# Patient Record
Sex: Female | Born: 1965 | Race: White | Hispanic: No | Marital: Married | Smoking: Former smoker
Health system: Southern US, Community
[De-identification: ages and names within clinical notes are randomized; demographics above are authoritative.]

## PROBLEM LIST (undated history)

## (undated) DIAGNOSIS — Z803 Family history of malignant neoplasm of breast: Secondary | ICD-10-CM

## (undated) DIAGNOSIS — F17200 Nicotine dependence, unspecified, uncomplicated: Secondary | ICD-10-CM

## (undated) DIAGNOSIS — Z720 Tobacco use: Secondary | ICD-10-CM

## (undated) DIAGNOSIS — Z460 Encounter for fitting and adjustment of spectacles and contact lenses: Secondary | ICD-10-CM

## (undated) HISTORY — PX: PILONIDAL CYST EXCISION: SHX744

## (undated) HISTORY — PX: FOOT SURGERY: SHX648

## (undated) HISTORY — DX: Tobacco use: Z72.0

## (undated) HISTORY — DX: Nicotine dependence, unspecified, uncomplicated: F17.200

## (undated) HISTORY — DX: Encounter for fitting and adjustment of spectacles and contact lenses: Z46.0

## (undated) HISTORY — DX: Family history of malignant neoplasm of breast: Z80.3

---

## 2001-02-17 ENCOUNTER — Other Ambulatory Visit: Admission: RE | Admit: 2001-02-17 | Discharge: 2001-02-17 | Payer: Self-pay | Admitting: Obstetrics and Gynecology

## 2004-03-28 ENCOUNTER — Ambulatory Visit: Payer: Self-pay | Admitting: Family Medicine

## 2004-04-29 ENCOUNTER — Ambulatory Visit: Payer: Self-pay | Admitting: Family Medicine

## 2004-06-25 ENCOUNTER — Ambulatory Visit: Payer: Self-pay | Admitting: Family Medicine

## 2004-09-05 ENCOUNTER — Ambulatory Visit: Payer: Self-pay | Admitting: Family Medicine

## 2005-01-16 ENCOUNTER — Ambulatory Visit: Payer: Self-pay | Admitting: Family Medicine

## 2005-03-23 ENCOUNTER — Ambulatory Visit: Payer: Self-pay | Admitting: Family Medicine

## 2005-04-21 ENCOUNTER — Ambulatory Visit: Payer: Self-pay | Admitting: Internal Medicine

## 2005-04-22 ENCOUNTER — Ambulatory Visit: Payer: Self-pay | Admitting: Family Medicine

## 2005-11-20 ENCOUNTER — Ambulatory Visit: Payer: Self-pay | Admitting: Family Medicine

## 2006-02-04 HISTORY — PX: TUBAL LIGATION: SHX77

## 2006-02-17 ENCOUNTER — Ambulatory Visit (HOSPITAL_COMMUNITY): Admission: RE | Admit: 2006-02-17 | Discharge: 2006-02-17 | Payer: Self-pay | Admitting: Obstetrics and Gynecology

## 2006-02-23 ENCOUNTER — Ambulatory Visit: Payer: Self-pay | Admitting: Family Medicine

## 2006-06-10 DIAGNOSIS — K449 Diaphragmatic hernia without obstruction or gangrene: Secondary | ICD-10-CM | POA: Insufficient documentation

## 2006-06-10 DIAGNOSIS — L259 Unspecified contact dermatitis, unspecified cause: Secondary | ICD-10-CM | POA: Insufficient documentation

## 2006-06-10 DIAGNOSIS — F17200 Nicotine dependence, unspecified, uncomplicated: Secondary | ICD-10-CM | POA: Insufficient documentation

## 2006-06-10 DIAGNOSIS — N390 Urinary tract infection, site not specified: Secondary | ICD-10-CM | POA: Insufficient documentation

## 2006-06-10 HISTORY — DX: Nicotine dependence, unspecified, uncomplicated: F17.200

## 2006-08-09 ENCOUNTER — Telehealth (INDEPENDENT_AMBULATORY_CARE_PROVIDER_SITE_OTHER): Payer: Self-pay | Admitting: Internal Medicine

## 2007-06-02 ENCOUNTER — Ambulatory Visit: Payer: Self-pay | Admitting: Family Medicine

## 2007-06-02 DIAGNOSIS — J069 Acute upper respiratory infection, unspecified: Secondary | ICD-10-CM | POA: Insufficient documentation

## 2007-09-13 ENCOUNTER — Ambulatory Visit: Payer: Self-pay | Admitting: Family Medicine

## 2007-11-30 ENCOUNTER — Telehealth: Payer: Self-pay | Admitting: Family Medicine

## 2008-06-25 ENCOUNTER — Ambulatory Visit: Payer: Self-pay | Admitting: Family Medicine

## 2008-07-05 ENCOUNTER — Ambulatory Visit: Payer: Self-pay | Admitting: Family Medicine

## 2008-07-05 DIAGNOSIS — J019 Acute sinusitis, unspecified: Secondary | ICD-10-CM | POA: Insufficient documentation

## 2008-12-19 ENCOUNTER — Ambulatory Visit: Payer: Self-pay | Admitting: Family Medicine

## 2009-04-04 ENCOUNTER — Ambulatory Visit: Payer: Self-pay | Admitting: Family Medicine

## 2009-04-10 ENCOUNTER — Telehealth (INDEPENDENT_AMBULATORY_CARE_PROVIDER_SITE_OTHER): Payer: Self-pay | Admitting: Internal Medicine

## 2009-11-07 ENCOUNTER — Encounter (INDEPENDENT_AMBULATORY_CARE_PROVIDER_SITE_OTHER): Payer: Self-pay | Admitting: *Deleted

## 2010-05-08 NOTE — Letter (Signed)
Summary: Shawna [name] letter  [place]  [address] [place], [place] [address]   Phone: [phone]  Fax: [phone]       11/07/2009 MRN: [id number]  Shawna [name] [address] [place], [place]  [address]  Dear Ms. [facility] [place] [place] announce the retirement of Arta Silence, M.D., from full-time practice at the [facility] office effective October 03, 2009 and his plans of returning part-time.  It is important to Dr. Hetty Ely and to our practice that you understand that [facility] has seven physicians in our office for your health care needs.  We will continue to offer the same exceptional care that you have today.    Dr. Hetty Ely has spoken to many of you about his plans for retirement and returning part-time in the fall.   We will continue to work with you through the transition to schedule appointments for you in the office and meet the high standards that [facility] is committed to.   Again, it is with great pleasure that we share the news that Dr. Hetty Ely will return to [facility] in October of 2011 with a reduced schedule.    If you have any questions, or would like to request an appointment with one of our physicians, please call us at [phone] and press the option for Scheduling an appointment.  We take pleasure in providing you with excellent patient care and look forward to seeing you at your next office visit.  Our [facility] Physicians are:  Tillman Abide, M.D. Laurita Quint, M.D. Roxy Manns, M.D. Kerby Nora, M.D. Hannah Beat, M.D. Ruthe Mannan, M.D. We proudly welcomed Raechel Ache, M.D. and Eustaquio Boyden, M.D. to the practice in July/August 2011.  Sincerely,  [facility]

## 2010-05-08 NOTE — Progress Notes (Signed)
Summary: Not feeling better  Phone Note Call from Patient Call back at [phone]   Caller: Patient Call For: [name] Summary of Call: pt was seen on 04/04/2009 and was given a z-pak for sinus infection, pt states she feels no better and would like something else called in, still with congestion.Please advise. Initial call taken by: Mervin Hack CMA Duncan Dull),  April 10, 2009 1:52 PM  Follow-up for Phone Call        still has ATB in her system, if not improved or if worsens, needs to be seen before Friday   Shawna Daniels Galen Russman [name]  April 10, 2009 3:56 PM   pt called back and states she has taken Levaquin in the past is this something she can take again? pt can't come in before friday. Pt would like rx called in to [facility]. DeShannon Smith CMA Duncan Dull)  April 10, 2009 4:42 PM   Additional Follow-up for Phone Call Additional follow up Details #1::        will need to be seen on Friday  Shawna [name]  April 10, 2009 5:17 PM   Patient notified as instructed by telephone. Pt said she just saw you and she is not going to pay another copay. Pt said she is the same or possibly a little worse now than when she saw you and wants a stronger antibiotic. Pt wants to talk with [name]. Pt can be reached at [phone]. Please advise.     Additional Follow-up for Phone Call Additional follow up Details #2::    will refill zpac without being seen  Shawna [name]  April 11, 2009 9:31 AM   Patient notified as instructed by telephone. Pt said she does not want the Z pack. Pt said she needs stronger antibiotic and pt strongly request Levaquin. Please advise. [name] LPN  April 11, 2009 11:25 AM   Pt has called back and wants to know if she is going to get the Levaquin. I asked Billie [name], [name] and she said she will give her a Zpack or she can come in to be seen. [name] said she has had another death in her family and has to get on the road and ask to speak  with [name]. [name] will call pt back at [phone]. [name] LPN  April 11, 2009 2:06 PM   called--will start on levaquin, Rx attatched  Shawna [name]  April 11, 2009 2:27 PM   New/Updated Medications: LEVAQUIN 500 MG TABS (LEVOFLOXACIN) take 1 daily [BMN] Prescriptions: LEVAQUIN 500 MG TABS (LEVOFLOXACIN) take 1 daily Brand medically necessary #5 x 0   Entered and Authorized by:   Gildardo Griffes [name]   Signed by:   Gildardo Griffes [name] on 04/11/2009   Method used:   Electronically to        [facility]* (retail)       [address]       [place], [place]  [address]       Ph: [phone]       Fax: [phone]   RxID:   [id number]

## 2010-06-16 ENCOUNTER — Ambulatory Visit: Payer: Self-pay | Admitting: Family Medicine

## 2010-06-16 ENCOUNTER — Encounter: Payer: Self-pay | Admitting: Family Medicine

## 2010-06-16 ENCOUNTER — Ambulatory Visit (INDEPENDENT_AMBULATORY_CARE_PROVIDER_SITE_OTHER): Payer: Federal, State, Local not specified - PPO | Admitting: Family Medicine

## 2010-06-16 DIAGNOSIS — J019 Acute sinusitis, unspecified: Secondary | ICD-10-CM

## 2010-06-24 NOTE — Assessment & Plan Note (Signed)
Summary: ? SINUS INFECTION   Vital Signs:  Patient profile:   45 year old female Height:      63 inches Weight:      186.75 pounds BMI:     33.20 Temp:     98.3 degrees F oral Pulse rate:   94 / minute Pulse rhythm:   regular BP sitting:   122 / 82  (left arm) Cuff size:   regular  Vitals Entered By: Linde Gillis CMA Duncan Dull) (June 16, 2010 11:52 AM) CC: ? sinus infection   History of Present Illness: Here for URI signsx 1 1/2 weeks. Started with runny nose, dry cough. Now cough keeping her up at night, frontal sinus pressure. Felt feverish last night.  Taking OTC Zyrtec, no relief of symptoms.  No CP, wheezing, or SOB.    Current Medications (verified): 1)  Multivitamins   Tabs (Multiple Vitamin) .... One Daily 2)  Lotrisone 1-0.05 % Crea (Clotrimazole-Betamethasone) .... Apply Two Times A Day Thinnly To Rash As Needed 3)  Azithromycin 250 Mg  Tabs (Azithromycin) .... 2 By  Mouth Today and Then 1 Daily For 4 Days  Allergies: 1)  ! Sulfa 2)  ! Macrobid 3)  ! * Amoxillin 4)  ! * Guaifenesin  Past History:  Past Medical History: Last updated: 07/05/2008 tab abuse  Past Surgical History: Last updated: 06/10/2006 Tubal ligation: bilateral:  02/2006 EGD:  07/1985, hiatal hernia, mild esophigitis  Family History: Last updated: 04/04/2009 Father:  Mother: small vesseldiseasse/dementia--died 09-Apr-2009--Alzheimer's Siblings:   Social History: Last updated: 07/05/2008 Marital Status: Married Children: 0 Occupation: [employer] Current Smoker cares for family member with alz  Risk Factors: Smoking Status: current (07/05/2008) Packs/Day: 08.01.2001 (06/10/2006)  Review of Systems General:  Complains of fever. CV:  Denies chest pain or discomfort. Resp:  Complains of cough; denies sputum productive and wheezing.  Physical Exam  General:  Well-developed,well-nourished,in no acute distress; alert,appropriate and cooperative throughout examination VSS Nose:  nares  are injected and congested bilaterally  frontal sinuses TTP, right> left Mouth:  pharynx pink and moist, no erythema, and no exudates.   Lungs:  diffusely distant bs good air exch  no rales/rhonchi or wheeze Heart:  Normal rate and regular rhythm. S1 and S2 normal without gallop, murmur, click, rub or other extra sounds. Psych:  normal affect, talkative and pleasant - pt seems fatigued    Impression & Recommendations:  Problem # 1:  SINUSITIS - ACUTE-NOS (ICD-461.9) Assessment New Given duration and progression of symptoms, will treat with Zpack (multiple abx allergies). Supportive care as per pt instructions. The following medications were removed from the medication list:    Levaquin 500 Mg Tabs (Levofloxacin) .Marland Kitchen... Take 1 daily Her updated medication list for this problem includes:    Azithromycin 250 Mg Tabs (Azithromycin) .Marland Kitchen... 2 by  mouth today and then 1 daily for 4 days  Complete Medication List: 1)  Multivitamins Tabs (Multiple vitamin) .... One daily 2)  Lotrisone 1-0.05 % Crea (Clotrimazole-betamethasone) .... Apply two times a day thinnly to rash as needed 3)  Azithromycin 250 Mg Tabs (Azithromycin) .... 2 by  mouth today and then 1 daily for 4 days  Patient Instructions: 1)  Take Zpack  as directed.  Drink lots of fluids.  Treat sympotmatically with Mucinex, nasal saline irrigation, and Tylenol/Ibuprofen. Also try claritin D or zyrtec D over the counter- two times a day as needed ( have to sign for them at pharmacy). You can use warm compresses.  Cough suppressant at  night. Call if not improving as expected in 5-7 days.  Prescriptions: AZITHROMYCIN 250 MG  TABS (AZITHROMYCIN) 2 by  mouth today and then 1 daily for 4 days  #6 x 0   Entered and Authorized by:   Ruthe Mannan MD   Signed by:   Ruthe Mannan MD on 06/16/2010   Method used:   Electronically to        [facility]* (retail)       [address]       [place], [place]  [address]       Ph: [phone]       Fax:  [phone]   RxID:   [id number]    Orders Added: 1)  Est. Patient Level III [40347]    Current Allergies (reviewed today): ! SULFA ! MACROBID ! * AMOXILLIN ! * GUAIFENESIN

## 2010-08-22 NOTE — Assessment & Plan Note (Signed)
[facility]                                 ON-CALL NOTE   NAME:Shawna [name], Shawna [name]                      MRN:          [id number]  DATE:02/25/2006                            DOB:          09/14/1965    TIME RECEIVED:  11:34 a.m.   CALLER:  Lavone Orn.   She sees [facility] [employer]   TELEPHONE:  [phone].   Patient is apparently having a medication reaction.  She has had  penicillin numerous times throughout her life; however, she is currently  taking amoxicillin 500 mg 2 tablets b.i.d. for a sinus infection.  She  has been taking it for four days.  Now for the past two days, she has  nausea, vomiting, and diarrhea.  Also an itchy rash all over her body.  She is not short of breath.   Our response is to stop the amoxicillin.  She is to drink fluids.  She  can take Benadryl as needed.  She is to contact the office tomorrow for  further instructions.     Tera Mater. Clent Ridges, MD  Electronically Signed    SAF/MedQ  DD: 02/25/2006  DT: 02/25/2006  Job #: [id number]

## 2010-08-22 NOTE — Op Note (Signed)
NAMEAASTHA, DAYLEY               ACCOUNT NO.:  [id number]   MEDICAL RECORD NO.:  [id number]          PATIENT TYPE:  AMB   LOCATION:  SDC                           FACILITY:  [facility]   PHYSICIAN:  Carrington Clamp, M.D. DATE OF BIRTH:  31-May-1965   DATE OF PROCEDURE:  02/17/2006  DATE OF DISCHARGE:                               OPERATIVE REPORT   PREOPERATIVE DIAGNOSIS:  Desires permit sterility.   POSTOPERATIVE DIAGNOSIS:  Desires permit sterility.   PROCEDURES:  Filshie clip bilateral tubal ligation through laparoscope.   ATTENDING PHYSICIAN:  Carrington Clamp, M.D.   ANESTHESIA:  General.   SPECIMENS:  None.   ESTIMATED BLOOD LOSS:  Minimal.   IV FLUIDS:  800 mL.   URINE OUTPUT:  Not measured.   COMPLICATIONS:  None.   FINDINGS:  Normal tubes and ovaries with the exception of small clear  cyst on the left tube.  There were adhesions of the tubes and fimbria to  the ovaries.  There are no other adhesions seen in the pelvis, however.  There was small fibroids on the serosal surface of the uterus.  The  appendix was not seen but the liver edge appeared normal.  There were no  other abnormalities of the cul-de-sac or the pelvis.   MEDICATIONS:  None.   COUNTS:  Correct x3.   TECHNIQUE:  After adequate general anesthesia was achieved, the patient  prepped, draped usual sterile fashion in dorsal lithotomy position.  A  Kahn cannula was placed inside the cervix after a single-tooth tenaculum  was used to stabilize the cervix.  The bladder was drained with red  rubber catheter during the procedure.  Attention was then turned to the  abdomen where a 2 cm infraumbilical incision was made with a scalpel.  The Veress needle was passed into the abdomen without aspiration of  bowel contents or blood.  The abdomen was insufflated and the 10 mm  trocar placed without complication.  The scope was placed inside the  abdomen.  The above findings noted.  Filshie clip  instrument was  then passed and a Filshie clip was placed on the isthmic  portion of each tube.  The photos were taken to prove that the Filshie  clip was   DICTATION ENDED HERE.      Carrington Clamp, M.D.  Electronically Signed     MH/MEDQ  D:  02/17/2006  T:  02/17/2006  Job:  [id number]

## 2010-09-25 ENCOUNTER — Telehealth: Payer: Self-pay | Admitting: *Deleted

## 2010-09-25 MED ORDER — BUPROPION HCL ER (SMOKING DET) 150 MG PO TB12: 150.0000 mg | ORAL_TABLET | Freq: Two times a day (BID) | ORAL | Status: AC

## 2010-09-25 NOTE — Telephone Encounter (Signed)
Left message on machine at home advising patient as instructed. 

## 2010-09-25 NOTE — Telephone Encounter (Signed)
Patient says that a couple of years ago she had taken wellbutrin to try and quit smoking and then a lot happened with her mother and she went off of it and was unable to quit smoking at the time. She is going on vacation in a couple of weeks and would really like to quit smoking. She is asking if she could get a rx for wellbutrin. Uses  [facility]. Please advise.

## 2010-09-25 NOTE — Telephone Encounter (Signed)
RX sent

## 2010-11-05 ENCOUNTER — Encounter: Payer: Self-pay | Admitting: Family Medicine

## 2010-11-05 ENCOUNTER — Ambulatory Visit (INDEPENDENT_AMBULATORY_CARE_PROVIDER_SITE_OTHER): Payer: Federal, State, Local not specified - PPO | Admitting: Family Medicine

## 2010-11-05 VITALS — BP 110/70 | HR 92 | Temp 98.4°F | Ht 63.0 in | Wt 187.8 lb

## 2010-11-05 DIAGNOSIS — L0591 Pilonidal cyst without abscess: Secondary | ICD-10-CM

## 2010-11-05 MED ORDER — AZITHROMYCIN 250 MG PO TABS: ORAL_TABLET | ORAL | Status: AC

## 2010-11-05 MED ORDER — HYDROCODONE-ACETAMINOPHEN 5-500 MG PO TABS: 1.0000 | ORAL_TABLET | Freq: Four times a day (QID) | ORAL | Status: DC | PRN

## 2010-11-05 MED ORDER — METRONIDAZOLE 500 MG PO TABS: 500.0000 mg | ORAL_TABLET | Freq: Three times a day (TID) | ORAL | Status: AC

## 2010-11-05 NOTE — Progress Notes (Signed)
  Subjective:    Patient ID: Shawna [name], female    DOB: 15-Dec-1965, 45 y.o.   MRN: [id number]  HPI Is having tailbone area pain  "hurts like hell"  Pain started on Monday afternoon   Has had a cyst there in the past -- it had to be drained and then removed  This is more on the side  ? Red   Hurts to sit   No hx of hemorrhoids   No pain with certain movements   No drainage   No fever - feels fine except for pain   Patient Active Problem List  Diagnoses  . TOBACCO ABUSE  . SINUSITIS - ACUTE-NOS  . URI  . HIATAL HERNIA  . UTI'S, CHRONIC  . ECZEMA, HANDS  . Infected pilonidal cyst   Past Medical History  Diagnosis Date  . Tobacco abuse    Past Surgical History  Procedure Date  . Tubal ligation 02/2006    bilateral   History  Substance Use Topics  . Smoking status: Current Everyday Smoker  . Smokeless tobacco: Not on file  . Alcohol Use:    No family history on file. Allergies  Allergen Reactions  . Amoxicillin   . Guaifenesin     REACTION: HA  . Nitrofurantoin   . Sulfonamide Derivatives    No current outpatient prescriptions on file prior to visit.         Review of Systems Review of Systems  Constitutional: Negative for fever, appetite change, fatigue and unexpected weight change.  Eyes: Negative for pain and visual disturbance.  Respiratory: Negative for cough and shortness of breath.   Cardiovascular: Negative.  for cp or palpitations Gastrointestinal: Negative for nausea, diarrhea and constipation.  Genitourinary: Negative for urgency and frequency.  Skin: Negative for pallor. or rash  Neurological: Negative for weakness, light-headedness, numbness and headaches.  Hematological: Negative for adenopathy. Does not bruise/bleed easily.  Psychiatric/Behavioral: Negative for dysphoric mood. The patient is not nervous/anxious.          Objective:   Physical Exam  Constitutional: She appears well-developed and well-nourished. No  distress.       overwt and well appearing   HENT:  Head: Normocephalic and atraumatic.  Mouth/Throat: Oropharynx is clear and moist.  Eyes: Conjunctivae and EOM are normal. Pupils are equal, round, and reactive to light.  Neck: Normal range of motion. Neck supple. No JVD present. No thyromegaly present.  Cardiovascular: Normal rate, regular rhythm, normal heart sounds and intact distal pulses.   Pulmonary/Chest: Effort normal and breath sounds normal. No respiratory distress. She has no wheezes.  Abdominal: Soft. Bowel sounds are normal. She exhibits no distension. There is no tenderness. There is no rebound.  Musculoskeletal: Normal range of motion.  Lymphadenopathy:    She has no cervical adenopathy.  Neurological: She is alert. She has normal reflexes.  Skin: Skin is warm and dry. No pallor.       1-2 cm area of induration/ erythema and heat in coccyx area just below prev pilonidal cyst scar  No drainage or hair noted  Very firm Moderately tender  Psychiatric: She has a normal mood and affect.          Assessment & Plan:

## 2010-11-05 NOTE — Assessment & Plan Note (Signed)
Pt has had one surgically removed in past and this is just below the scar  Is very firm with no drainage For cellulitis - px flagyl and zithromax (is pcn all)  Warm compresses often- keep clean  Ref to surgeon for eval and tx Adv to call if worse pain /fever or other symptoms or seek care in ER if after hours

## 2010-11-05 NOTE — Patient Instructions (Signed)
Take the zithromax and the metronizadole as directed  Warm compresses as often as you can  If increase in pain or any fever - call or go to ER or urgent care if after hours  We will do surgical referral at check out  Use pain pill (vicodin) if needed- but watch out for sedation

## 2010-11-06 ENCOUNTER — Encounter (INDEPENDENT_AMBULATORY_CARE_PROVIDER_SITE_OTHER): Payer: Self-pay | Admitting: Surgery

## 2010-11-06 ENCOUNTER — Ambulatory Visit (INDEPENDENT_AMBULATORY_CARE_PROVIDER_SITE_OTHER): Payer: Federal, State, Local not specified - PPO | Admitting: Surgery

## 2010-11-06 VITALS — BP 106/78 | HR 64 | Temp 96.8°F | Ht 63.5 in | Wt 185.6 lb

## 2010-11-06 DIAGNOSIS — L0591 Pilonidal cyst without abscess: Secondary | ICD-10-CM

## 2010-11-06 NOTE — Patient Instructions (Addendum)
Pilonidal Cyst Care After A pilonidal cyst occurs when hairs get trapped (ingrown) beneath the skin in the crease between the buttocks over your sacrum (the bone under that crease). Pilonidal cysts are most common in young men with a lot of body hair. When the cyst breaks (ruptured) or leaks, fluid from the cyst may cause burning and itching. If the cyst becomes infected, it causes a painful swelling filled with pus (abscess). The pus and trapped hairs need to be removed (often by lancing) so that the infection can heal. The word pilonidal means hair nest. HOME CARE INSTRUCTIONS AFTER YOUR SURGERY The cyst WAS INFECTED AND DRAINED:  Your caregiver packed the wound with gauze to keep the wound open. This allows the wound to heal from the inside outward and continue to drain.  The packing should gradually fall out over time. Trimmed excess tails of the packing these next few days until completely falls out. Then start packing.  Return in 1 week for a wound check.   If you take tub baths or showers, repack the wound with gauze as directed following. Sponge baths are a good alternative. Sitz baths may be used three to four times a day or as directed.   Continue the antibiotic to fight the infection as directed.   Only take over-the-counter or prescription medicines for pain, discomfort, or fever as directed by your caregiver.   If a drain was in place and removed, use sitz baths for 20 minutes 4 times per day. Clean the wound gently with mild unscented soap, pat dry, and then apply a dry dressing as directed.   COMPLICATIONS All surgery may have complications with the very best of care. Some complications of this surgery include:  Infection.  Long term drainage.   Bleeding.  Possibly further surgery.   Long term wound care.  Non healing wound.   SEEK MEDICAL CARE IF:  You have increased pain, swelling, redness, drainage, or bleeding from the area.   An oral temperature above 102F  develops.   You have muscles aches, dizziness, or a general ill feeling.  Document Released: 04/23/2006 Document Re-Released: 09/10/2009 [facility] Patient Information 2011 [place], [place].

## 2010-11-06 NOTE — Progress Notes (Addendum)
Subjective:     Patient ID: Shawna [name], female   DOB: 11-26-1965, 45 y.o.   MRN: [id number]  HPI  Pleasant female with a history of prior pilonidal disease. She had an I&D done 20 years ago by Dr. Derrell Lolling.  It recurred. She required a more formal excision and closure.  She's not had any problems since then until 3 days ago she started feeling pain and swelling. It got worse. She saw her primary care physician. Dr. Lucretia Roers patient is over concerns of a new abscess.  No fevers chills or sweats. She is on oral antibiotics. Review of Systems  Constitutional: Negative for fever, chills, diaphoresis, appetite change and fatigue.  HENT: Negative for ear pain, sore throat, trouble swallowing, neck pain and ear discharge.   Eyes: Negative for photophobia, discharge and visual disturbance.  Respiratory: Negative for cough, choking, chest tightness and shortness of breath.   Cardiovascular: Negative for chest pain and palpitations.  Gastrointestinal: Negative for nausea, vomiting, abdominal pain, diarrhea, constipation, anal bleeding and rectal pain.  Genitourinary: Negative for dysuria, frequency and difficulty urinating.  Musculoskeletal: Negative for myalgias and gait problem.  Skin: Negative for color change, pallor and rash.  Neurological: Negative for dizziness, speech difficulty, weakness and numbness.  Hematological: Negative for adenopathy.  Psychiatric/Behavioral: Negative for confusion and agitation. The patient is not nervous/anxious.        Objective:   Physical Exam  Constitutional: She is oriented to person, place, and time. She appears well-developed and well-nourished. No distress.  HENT:  Head: Normocephalic.  Mouth/Throat: Oropharynx is clear and moist. No oropharyngeal exudate.  Eyes: Conjunctivae and EOM are normal. Pupils are equal, round, and reactive to light. No scleral icterus.  Neck: Normal range of motion. Neck supple. No tracheal deviation present.    Cardiovascular: Normal rate, regular rhythm and intact distal pulses.   Pulmonary/Chest: Effort normal and breath sounds normal. No respiratory distress. She exhibits no tenderness.  Abdominal: Soft. She exhibits no distension and no mass. There is no tenderness. Hernia confirmed negative in the right inguinal area and confirmed negative in the left inguinal area.  Genitourinary: No vaginal discharge found.       3 x 5 cm area of induration along the left pilonidal region. Central skin ulceration.  After getting informed consent, I drained the area under local anesthetic.  I did not encounter a very large cavity but I was able to explore close down to the bone. I did evacuate some necrotic fat / cheese material. I packed it with the NU ribbon gauze. She tolerated well.  Musculoskeletal: Normal range of motion. She exhibits no tenderness.  Lymphadenopathy:    She has no cervical adenopathy.       Right: No inguinal adenopathy present.       Left: No inguinal adenopathy present.  Neurological: She is alert and oriented to person, place, and time. No cranial nerve deficit. She exhibits normal muscle tone. Coordination normal.  Skin: Skin is warm and dry. No rash noted. She is not diaphoretic. No erythema.  Psychiatric: She has a normal mood and affect. Her behavior is normal. Judgment and thought content normal.       Assessment:     Pilonidal abscess left paramedian. Status post incision and drainage.    Plan:     Continue antibiotics.  Allow packing for the next few days. Trim if part falls out. Then start repacking. She similar this and felt comfortable doing as herself.  Return to clinic  next week for wound check.  If markedly worse, she may require admission and reoperation. However, I think that's not likely. We gave her handouts. She expressed understanding and appreciation.

## 2010-11-12 ENCOUNTER — Encounter (INDEPENDENT_AMBULATORY_CARE_PROVIDER_SITE_OTHER): Payer: Self-pay | Admitting: Surgery

## 2010-11-12 ENCOUNTER — Ambulatory Visit (INDEPENDENT_AMBULATORY_CARE_PROVIDER_SITE_OTHER): Payer: Federal, State, Local not specified - PPO | Admitting: Surgery

## 2010-11-12 VITALS — BP 118/78 | HR 68 | Temp 96.6°F

## 2010-11-12 DIAGNOSIS — L0591 Pilonidal cyst without abscess: Secondary | ICD-10-CM

## 2010-11-12 NOTE — Patient Instructions (Signed)
Dressing Change Dressings are placed over wounds to keep them clean, dry, and protected from further injury. This provides an environment that favors wound healing. Good wound care includes resting and elevating the injured part until the pain and swelling are better. Change your wound dressing as recommended by your caregiver. When removing an old dressing, lift it slowly away from the wound. If the dressing sticks to the wound, dampen it with half-strength peroxide or tap water. Clean the wound gently with a moist cloth in shower/bathtub, remove any loose material. It is okay for a wound to get wet. Wash it with mildly soapy water. Watch for signs of infection when changing a dressing.  SEEK MEDICAL CARE IF YOU DEVELOP:  Increased pain, redness, or swelling.   Pus-like drainage from the wound.  Fever greater than 102 F

## 2010-11-12 NOTE — Progress Notes (Signed)
Subjective:     Patient ID: Shawna [name], female   DOB: Mar 16, 1966, 45 y.o.   MRN: [id number]  HPI  Diagnosis: Infected pilonidal cyst  Procedure: Incision and drainage November 06, 2010  The patient comes in today feeling much better. She comes today with her husband. The wick has not completely fallen out. However, the husband is trimming it out slowly.   She is moving her bowels 4 times a day. That is her baseline. She is almost done with her antibiotics (Zithromax, metronidazole).  No fevers chills or sweats. She is on oral antibiotics.  Review of Systems  Constitutional: Negative for fever, chills, diaphoresis, appetite change and fatigue.  HENT: Negative for ear pain, sore throat, trouble swallowing, neck pain and ear discharge.   Eyes: Negative for photophobia, discharge and visual disturbance.  Respiratory: Negative for cough, choking, chest tightness and shortness of breath.   Cardiovascular: Negative for chest pain and palpitations.  Gastrointestinal: Negative for nausea, vomiting, abdominal pain, diarrhea, constipation, anal bleeding and rectal pain.  Genitourinary: Negative for dysuria, frequency and difficulty urinating.  Musculoskeletal: Negative for myalgias and gait problem.  Skin: Negative for color change, pallor and rash.  Neurological: Negative for dizziness, speech difficulty, weakness and numbness.  Hematological: Negative for adenopathy.  Psychiatric/Behavioral: Negative for confusion and agitation. The patient is not nervous/anxious.        Objective:   Physical Exam  Constitutional: She is oriented to person, place, and time. She appears well-developed and well-nourished. No distress.  HENT:  Head: Normocephalic.  Mouth/Throat: Oropharynx is clear and moist. No oropharyngeal exudate.  Eyes: Conjunctivae and EOM are normal. Pupils are equal, round, and reactive to light. No scleral icterus.  Neck: Normal range of motion. Neck supple. No tracheal deviation  present.  Cardiovascular: Normal rate, regular rhythm and intact distal pulses.   Pulmonary/Chest: Effort normal and breath sounds normal. No respiratory distress. She exhibits no tenderness.  Abdominal: Soft. She exhibits no distension and no mass. There is no tenderness. Hernia confirmed negative in the right inguinal area and confirmed negative in the left inguinal area.  Genitourinary: No vaginal discharge found.       3 x 1.5 cm area of induration along the left pilonidal region. Decreased & less tender  Nu gauze packing removed.  Central wound with some skin necrosis 1-61mm.  Granulation at base.  I repacked with gauze.  Musculoskeletal: Normal range of motion. She exhibits no tenderness.  Lymphadenopathy:    She has no cervical adenopathy.       Right: No inguinal adenopathy present.       Left: No inguinal adenopathy present.  Neurological: She is alert and oriented to person, place, and time. No cranial nerve deficit. She exhibits normal muscle tone. Coordination normal.  Skin: Skin is warm and dry. No rash noted. She is not diaphoretic. No erythema.  Psychiatric: She has a normal mood and affect. Her behavior is normal. Judgment and thought content normal.       Assessment:     Pilonidal abscess left paramedian. Status post incision and drainage.    Plan:     Complete antibiotics.  Remove dressing. Clean wound and shower bathtub. Then repack with gauze. Do this every day. Her husband felt comfortable doing this.  Return to clinic in 2 weeks for wound check.  If markedly worse, she may require admission and reoperation. However, I think that's not likely. She expressed understanding and appreciation.

## 2010-11-26 ENCOUNTER — Encounter (INDEPENDENT_AMBULATORY_CARE_PROVIDER_SITE_OTHER): Payer: Self-pay | Admitting: Surgery

## 2010-11-26 ENCOUNTER — Ambulatory Visit (INDEPENDENT_AMBULATORY_CARE_PROVIDER_SITE_OTHER): Payer: Federal, State, Local not specified - PPO | Admitting: Surgery

## 2010-11-26 VITALS — BP 112/80 | HR 64 | Temp 96.7°F | Wt 185.0 lb

## 2010-11-26 DIAGNOSIS — L299 Pruritus, unspecified: Secondary | ICD-10-CM | POA: Insufficient documentation

## 2010-11-26 DIAGNOSIS — L290 Pruritus ani: Secondary | ICD-10-CM

## 2010-11-26 DIAGNOSIS — L0591 Pilonidal cyst without abscess: Secondary | ICD-10-CM

## 2010-11-26 NOTE — Patient Instructions (Signed)
See Handout on pruritis ani

## 2010-11-26 NOTE — Progress Notes (Signed)
Subjective:     Patient ID: Shawna [name], female   DOB: 09/11/1965, 45 y.o.   MRN: [id number]  HPI  Diagnosis: Infected pilonidal cyst  Procedure: Incision and drainage November 06, 2010  The patient comes in today feeling much better.  Wound no longer needs to be packed.  No fevers chills or sweats. She is off antibiotics.  Review of Systems  Constitutional: Negative for fever, chills, diaphoresis, appetite change and fatigue.  HENT: Negative for ear pain, sore throat, trouble swallowing, neck pain and ear discharge.   Eyes: Negative for photophobia, discharge and visual disturbance.  Respiratory: Negative for cough, choking, chest tightness and shortness of breath.   Cardiovascular: Negative for chest pain and palpitations.  Gastrointestinal: Negative for nausea, vomiting, abdominal pain, diarrhea, constipation, anal bleeding and rectal pain.  Genitourinary: Negative for dysuria, frequency and difficulty urinating.  Musculoskeletal: Negative for myalgias and gait problem.  Skin: Negative for color change, pallor and rash.  Neurological: Negative for dizziness, speech difficulty, weakness and numbness.  Hematological: Negative for adenopathy.  Psychiatric/Behavioral: Negative for confusion and agitation. The patient is not nervous/anxious.        Objective:   Physical Exam  Constitutional: She is oriented to person, place, and time. She appears well-developed and well-nourished. No distress.  HENT:  Head: Normocephalic.  Mouth/Throat: Oropharynx is clear and moist. No oropharyngeal exudate.  Eyes: Conjunctivae and EOM are normal. Pupils are equal, round, and reactive to light. No scleral icterus.  Neck: Normal range of motion. Neck supple. No tracheal deviation present.  Cardiovascular: Normal rate, regular rhythm and intact distal pulses.   Pulmonary/Chest: Effort normal and breath sounds normal. No respiratory distress. She exhibits no tenderness.  Abdominal: Soft. She  exhibits no distension and no mass. There is no tenderness. Hernia confirmed negative in the right inguinal area and confirmed negative in the left inguinal area.  Genitourinary: No vaginal discharge found.       Superficial wound with granulation.  5x13mm.  I covered with gauze.  Rash closer to anus 4x4cm c/w pruritis.  Musculoskeletal: Normal range of motion. She exhibits no tenderness.  Lymphadenopathy:    She has no cervical adenopathy.       Right: No inguinal adenopathy present.       Left: No inguinal adenopathy present.  Neurological: She is alert and oriented to person, place, and time. No cranial nerve deficit. She exhibits normal muscle tone. Coordination normal.  Skin: Skin is warm and dry. No rash noted. She is not diaphoretic. No erythema.  Psychiatric: She has a normal mood and affect. Her behavior is normal. Judgment and thought content normal.       Assessment:     Pilonidal abscess left paramedian status post incision and drainage.  Pruritis ani.    Plan:     Clean wound daily & then repack with gauze. Return to clinic in 2 weeks for wound check.  Dry the perianal region with gauze/cotton balls, powder to control the rash.  Otherwise another setup for recurrent infection.    Hold off on more aggressive pilonidal excision as she is healing well.

## 2010-12-10 ENCOUNTER — Encounter (INDEPENDENT_AMBULATORY_CARE_PROVIDER_SITE_OTHER): Payer: Federal, State, Local not specified - PPO | Admitting: Surgery

## 2010-12-11 ENCOUNTER — Ambulatory Visit (INDEPENDENT_AMBULATORY_CARE_PROVIDER_SITE_OTHER): Payer: Federal, State, Local not specified - PPO | Admitting: Surgery

## 2010-12-11 ENCOUNTER — Encounter (INDEPENDENT_AMBULATORY_CARE_PROVIDER_SITE_OTHER): Payer: Self-pay | Admitting: Surgery

## 2010-12-11 VITALS — BP 116/78 | HR 60 | Temp 97.5°F | Ht 63.0 in | Wt 185.4 lb

## 2010-12-11 DIAGNOSIS — L0591 Pilonidal cyst without abscess: Secondary | ICD-10-CM

## 2010-12-11 DIAGNOSIS — L290 Pruritus ani: Secondary | ICD-10-CM

## 2010-12-11 NOTE — Progress Notes (Signed)
Subjective:     Patient ID: Shawna [name], female   DOB: [birth year], 45 y.o.   MRN: [id number]  HPI  Diagnosis: Infected pilonidal cyst  Procedure: Incision and drainage November 06, 2010  The patient comes in today feeling much better.  Wound no longer needs to be packed.  No fevers chills or sweats. She is off antibiotics.  Review of Systems  Constitutional: Negative for fever, chills, diaphoresis, appetite change and fatigue.  HENT: Negative for ear pain, sore throat, trouble swallowing, neck pain and ear discharge.   Eyes: Negative for photophobia, discharge and visual disturbance.  Respiratory: Negative for cough, choking, chest tightness and shortness of breath.   Cardiovascular: Negative for chest pain and palpitations.  Gastrointestinal: Negative for nausea, vomiting, abdominal pain, diarrhea, constipation, anal bleeding and rectal pain.  Genitourinary: Negative for dysuria, frequency and difficulty urinating.  Musculoskeletal: Negative for myalgias and gait problem.  Skin: Negative for color change, pallor and rash.  Neurological: Negative for dizziness, speech difficulty, weakness and numbness.  Hematological: Negative for adenopathy.  Psychiatric/Behavioral: Negative for confusion and agitation. The patient is not nervous/anxious.        Objective:   Physical Exam  Constitutional: She is oriented to person, place, and time. She appears well-developed and well-nourished. No distress.  HENT:  Head: Normocephalic.  Mouth/Throat: Oropharynx is clear and moist. No oropharyngeal exudate.  Eyes: Conjunctivae and EOM are normal. Pupils are equal, round, and reactive to light. No scleral icterus.  Neck: Normal range of motion. Neck supple. No tracheal deviation present.  Cardiovascular: Normal rate, regular rhythm and intact distal pulses.   Pulmonary/Chest: Effort normal and breath sounds normal. No respiratory distress. She exhibits no tenderness.  Abdominal: Soft. She  exhibits no distension and no mass. There is no tenderness. Hernia confirmed negative in the right inguinal area and confirmed negative in the left inguinal area.  Genitourinary: No vaginal discharge found.       No open wounds.  No rash/pruritis  Musculoskeletal: Normal range of motion. She exhibits no tenderness.  Lymphadenopathy:    She has no cervical adenopathy.       Right: No inguinal adenopathy present.       Left: No inguinal adenopathy present.  Neurological: She is alert and oriented to person, place, and time. No cranial nerve deficit. She exhibits normal muscle tone. Coordination normal.  Skin: Skin is warm and dry. No rash noted. She is not diaphoretic. No erythema.  Psychiatric: She has a normal mood and affect. Her behavior is normal. Judgment and thought content normal.       Assessment:     Pilonidal abscess left paramedian status post incision and drainage, healed.  Pruritis ani.    Plan:     Dry the perianal region with gauze/cotton balls, powder to control the rash.  Otherwise another setup for recurrent infection.    Hold off on more aggressive pilonidal excision as she has healed well.  RTC PRN.  She Expressed understanding and appreciation.

## 2010-12-26 ENCOUNTER — Other Ambulatory Visit: Payer: Self-pay | Admitting: *Deleted

## 2010-12-26 MED ORDER — BUPROPION HCL ER (SR) 150 MG PO TB12: 150.0000 mg | ORAL_TABLET | Freq: Two times a day (BID) | ORAL | Status: DC

## 2010-12-26 NOTE — Telephone Encounter (Signed)
rx sent

## 2010-12-26 NOTE — Telephone Encounter (Signed)
Pt is asking for one more refill on wellbutrin. She takes this to help with her smoking, she says she is doing well with that, but thinks she needs one more month of wellbutrin to get her through.  [facility].

## 2011-02-20 ENCOUNTER — Ambulatory Visit (INDEPENDENT_AMBULATORY_CARE_PROVIDER_SITE_OTHER): Payer: Federal, State, Local not specified - PPO | Admitting: Family Medicine

## 2011-02-20 ENCOUNTER — Encounter: Payer: Self-pay | Admitting: Family Medicine

## 2011-02-20 VITALS — BP 122/74 | HR 69 | Temp 98.2°F | Ht 63.0 in | Wt 201.0 lb

## 2011-02-20 DIAGNOSIS — J019 Acute sinusitis, unspecified: Secondary | ICD-10-CM

## 2011-02-20 MED ORDER — AZITHROMYCIN 250 MG PO TABS: ORAL_TABLET | ORAL | Status: DC

## 2011-02-20 NOTE — Progress Notes (Signed)
SUBJECTIVE:  Shawna [name] is a 45 y.o. female who complains of coryza, congestion, bilateral sinus pain and fever for 8 days. She denies a history of anorexia, chest pain, chills and dizziness and denies a history of asthma. Patient denies smoke cigarettes.   Patient Active Problem List  Diagnoses  . TOBACCO ABUSE  . SINUSITIS - ACUTE-NOS  . URI  . HIATAL HERNIA  . UTI'S, CHRONIC  . ECZEMA, HANDS  . Infected pilonidal cyst, s/p I&D   Past Medical History  Diagnosis Date  . Tobacco abuse   . Family history of breast cancer     mother  . Contact lens/glasses fitting    Past Surgical History  Procedure Date  . Tubal ligation 02/2006    bilateral  . Foot surgery   . Pilonidal cyst excision 1990's, 2012    I&D [id number]   History  Substance Use Topics  . Smoking status: Current Everyday Smoker  . Smokeless tobacco: Not on file  . Alcohol Use: No   Family History  Problem Relation Age of Onset  . Alzheimer's disease Mother   . Cancer Father     lung   Allergies  Allergen Reactions  . Amoxicillin Itching and Rash    Starts at feet and works its way up her body.  . Nitrofurantoin Nausea And Vomiting  . Sulfonamide Derivatives Itching and Rash    Starts at feet and works its way up the body.  . Guaifenesin     REACTION: HA   Current Outpatient Prescriptions on File Prior to Visit  Medication Sig Dispense Refill  . buPROPion (WELLBUTRIN SR) 150 MG 12 hr tablet Take 1 tablet (150 mg total) by mouth 2 (two) times daily.  60 tablet  0  . clotrimazole-betamethasone (LOTRISONE) cream Apply topically 2 (two) times daily as needed.        . etodolac (LODINE) 400 MG tablet Take 400 mg by mouth 2 (two) times daily.        Marland Kitchen HYDROcodone-acetaminophen (VICODIN) 5-500 MG per tablet Take 1 tablet by mouth every 6 (six) hours as needed for pain.  20 tablet  0  . Multiple Vitamin (MULTIVITAMIN) tablet Take 1 tablet by mouth daily.         The PMH, PSH, Social History, Family  History, Medications, and allergies have been reviewed in [facility], and have been updated if relevant.  OBJECTIVE: BP 122/74  Pulse 69  Temp(Src) 98.2 F (36.8 C) (Oral)  Ht 5\' 3"  (1.6 m)  Wt 201 lb (91.173 kg)  BMI 35.61 kg/m2  She appears well, vital signs are as noted. Ears normal.  Throat and pharynx normal.  Neck supple. No adenopathy in the neck. Nose is congested. Sinuses  Tender throughout. The chest is clear, without wheezes or rales.  ASSESSMENT:  sinusitis  PLAN: Given duration and progression of symptoms, will treat for bacterial sinusitis. Symptomatic therapy suggested: push fluids, rest and return office visit prn if symptoms persist or worsen.  Call or return to clinic prn if these symptoms worsen or fail to improve as anticipated.

## 2011-02-20 NOTE — Patient Instructions (Signed)
Take antibiotic as directed.  Drink lots of fluids.  Treat sympotmatically with Mucinex, nasal saline irrigation, and Tylenol/Ibuprofen. Also try claritin D or zyrtec D over the counter- two times a day as needed ( have to sign for them at pharmacy). You can use warm compresses.  Cough suppressant at night. Call if not improving as expected in 5-7 days.    

## 2011-04-07 HISTORY — PX: CERVICAL DISC SURGERY: SHX588

## 2011-06-02 ENCOUNTER — Telehealth: Payer: Self-pay | Admitting: Family Medicine

## 2011-06-02 MED ORDER — FLUCONAZOLE 150 MG PO TABS: 150.0000 mg | ORAL_TABLET | Freq: Once | ORAL | Status: AC

## 2011-06-02 NOTE — Telephone Encounter (Signed)
Medicine sent to [facility], left message on voice mail advising patient.

## 2011-06-02 NOTE — Telephone Encounter (Signed)
Ok to send diflucan 150 mg po x 1 with no refills to her pharmacy.

## 2011-06-02 NOTE — Telephone Encounter (Signed)
Triage Record Num: [id number] Operator: Jeraldine Loots Patient Name: Shawna [name] Call Date & Time: 06/02/2011 1:17:08PM Patient Phone: [phone] PCP: Ruthe Mannan Patient Gender: Female PCP Fax : [phone] Patient DOB: 1966-04-05 Practice Name: [facility] Reason for Call: Caller: [place]/Patient; PCP: Ruthe Mannan Nestor Ramp); CB#: [phone]; Uses [facility]. Had a steroid injection last week and was told that she could get a yeast infection. She has a vaginal discharge with buring and irritation. No fever. LMP-doesn't have one per pt. She is asking for medication for this. Protocol(s) Used: Vaginal Discharge or Irritation Recommended Outcome per Protocol: See Provider within 24 hours Reason for Outcome: Genital itching, burning or redness Care Advice: ~ SYMPTOM / CONDITION MANAGEMENT 06/02/2011 1:21:53PM Page 1 of 1 [email or web address]

## 2011-07-08 ENCOUNTER — Encounter: Payer: Self-pay | Admitting: Family Medicine

## 2011-07-08 ENCOUNTER — Ambulatory Visit (INDEPENDENT_AMBULATORY_CARE_PROVIDER_SITE_OTHER): Payer: Federal, State, Local not specified - PPO | Admitting: Family Medicine

## 2011-07-08 VITALS — BP 110/80 | HR 64 | Temp 98.3°F | Wt 203.0 lb

## 2011-07-08 DIAGNOSIS — K5289 Other specified noninfective gastroenteritis and colitis: Secondary | ICD-10-CM

## 2011-07-08 DIAGNOSIS — K529 Noninfective gastroenteritis and colitis, unspecified: Secondary | ICD-10-CM

## 2011-07-08 NOTE — Progress Notes (Signed)
(  S) Shawna [name] is a 46 y.o. female with complaint of gastrointestinal symptoms of nausea, anorexia, HA for 3 days. No blood in stool. Husband had similar symptoms. Has not yet vomited.  Patient Active Problem List  Diagnoses  . TOBACCO ABUSE  . SINUSITIS - ACUTE-NOS  . URI  . HIATAL HERNIA  . UTI'S, CHRONIC  . ECZEMA, HANDS  . Infected pilonidal cyst, s/p I&D   Past Medical History  Diagnosis Date  . Tobacco abuse   . Family history of breast cancer     mother  . Contact lens/glasses fitting    Past Surgical History  Procedure Date  . Tubal ligation 02/2006    bilateral  . Foot surgery   . Pilonidal cyst excision 1990's, 2012    I&D [id number]   History  Substance Use Topics  . Smoking status: Former Games developer  . Smokeless tobacco: Not on file   Comment: Quit 12/11/10.  Marland Kitchen Alcohol Use: No   Family History  Problem Relation Age of Onset  . Alzheimer's disease Mother   . Cancer Father     lung   Allergies  Allergen Reactions  . Amoxicillin Itching and Rash    Starts at feet and works its way up her body.  . Nitrofurantoin Nausea And Vomiting  . Sulfonamide Derivatives Itching and Rash    Starts at feet and works its way up the body.  . Guaifenesin     REACTION: HA   Current Outpatient Prescriptions on File Prior to Visit  Medication Sig Dispense Refill  . Multiple Vitamin (MULTIVITAMIN) tablet Take 1 tablet by mouth daily.        . clotrimazole-betamethasone (LOTRISONE) cream Apply 1 application topically 2 (two) times daily.         The PMH, PSH, Social History, Family History, Medications, and allergies have been reviewed in [facility], and have been updated if relevant.  (O)  BP 110/80  Pulse 64  Temp(Src) 98.3 F (36.8 C) (Oral)  Wt 203 lb (92.08 kg)  Physical exam reveals the patient appears well. Hydration status: well hydrated. Abdomen: abdomen is soft without significant tenderness, masses, organomegaly or guarding..  (A) Viral Gastroenteritis  (P) I  have recommended small amounts clear fluids frequently, soups, juices, water and advance diet as tolerated. Return office visit if symptoms persist or worsen; I have alerted the patient to call if high fever, dehydration, marked weakness, fainting, increased abdominal pain, blood in stool or vomit.

## 2011-09-29 ENCOUNTER — Telehealth: Payer: Self-pay | Admitting: Family Medicine

## 2011-09-29 NOTE — Telephone Encounter (Signed)
Caller: Shawna [name]/Patient; PCP: Ruthe Mannan (Nestor Ramp); CB#: [phone];  Call regarding Med ?Marland KitchenPt would like medication to assist her in wt loss. Advised to make an appt for consult. She will call back for an appt.

## 2011-10-01 ENCOUNTER — Encounter: Payer: Self-pay | Admitting: Family Medicine

## 2011-10-01 ENCOUNTER — Ambulatory Visit (INDEPENDENT_AMBULATORY_CARE_PROVIDER_SITE_OTHER): Payer: Federal, State, Local not specified - PPO | Admitting: Family Medicine

## 2011-10-01 VITALS — BP 104/78 | HR 88 | Temp 99.2°F | Wt 199.0 lb

## 2011-10-01 DIAGNOSIS — J019 Acute sinusitis, unspecified: Secondary | ICD-10-CM

## 2011-10-01 MED ORDER — BENZONATATE 100 MG PO CAPS: 100.0000 mg | ORAL_CAPSULE | Freq: Two times a day (BID) | ORAL | Status: AC | PRN

## 2011-10-01 MED ORDER — AZITHROMYCIN 250 MG PO TABS: ORAL_TABLET | ORAL | Status: AC

## 2011-10-01 NOTE — Progress Notes (Signed)
SUBJECTIVE:  Shawna [name] is a 46 y.o. female who complains of coryza, congestion, sneezing, sore throat, bilateral sinus pain and hoarseness for 8 days. She denies a history of anorexia, chest pain, dizziness and shortness of breath and denies a history of asthma. Patient denies smoke cigarettes.   Patient Active Problem List  Diagnosis  . TOBACCO ABUSE  . SINUSITIS - ACUTE-NOS  . URI  . HIATAL HERNIA  . UTI'S, CHRONIC  . ECZEMA, HANDS  . Infected pilonidal cyst, s/p I&D   Past Medical History  Diagnosis Date  . Tobacco abuse   . Family history of breast cancer     mother  . Contact lens/glasses fitting    Past Surgical History  Procedure Date  . Tubal ligation 02/2006    bilateral  . Foot surgery   . Pilonidal cyst excision 1990's, 2012    I&D [id number]   History  Substance Use Topics  . Smoking status: Former Games developer  . Smokeless tobacco: Not on file   Comment: Quit 12/11/10.  Marland Kitchen Alcohol Use: No   Family History  Problem Relation Age of Onset  . Alzheimer's disease Mother   . Cancer Father     lung   Allergies  Allergen Reactions  . Amoxicillin Itching and Rash    Starts at feet and works its way up her body.  . Nitrofurantoin Nausea And Vomiting  . Sulfonamide Derivatives Itching and Rash    Starts at feet and works its way up the body.  . Guaifenesin     REACTION: HA   Current Outpatient Prescriptions on File Prior to Visit  Medication Sig Dispense Refill  . clotrimazole-betamethasone (LOTRISONE) cream Apply 1 application topically 2 (two) times daily.        . Multiple Vitamin (MULTIVITAMIN) tablet Take 1 tablet by mouth daily.         The PMH, PSH, Social History, Family History, Medications, and allergies have been reviewed in [facility], and have been updated if relevant.  OBJECTIVE: BP 104/78  Pulse 88  Temp 99.2 F (37.3 C)  Wt 199 lb (90.266 kg) Wt Readings from Last 3 Encounters:  10/01/11 199 lb (90.266 kg)  07/08/11 203 lb (92.08 kg)  02/20/11  201 lb (91.173 kg)     She appears well, vital signs are as noted. Ears normal.  Throat and pharynx normal.  Neck supple. No adenopathy in the neck. Nose is congested. Sinuses non tender. The chest is clear, without wheezes or rales.  ASSESSMENT:  sinusitis  PLAN: Given duration and progression of symptoms, will treat for bacterial sinusitis with Zpack (PCN allergic). Symptomatic therapy suggested: push fluids, rest and return office visit prn if symptoms persist or worsen.Call or return to clinic prn if these symptoms worsen or fail to improve as anticipated.

## 2011-10-01 NOTE — Patient Instructions (Addendum)
Take antibiotic as directed.  Drink lots of fluids.  Treat sympotmatically with Mucinex, nasal saline irrigation, and Tylenol/Ibuprofen. You can use warm compresses.  Cough suppressant as directed.  Call if not improving as expected in 5-7 days.   Belvig is the name of the diet medication.

## 2011-10-02 ENCOUNTER — Telehealth: Payer: Self-pay | Admitting: Family Medicine

## 2011-10-02 ENCOUNTER — Ambulatory Visit: Payer: Federal, State, Local not specified - PPO | Admitting: Family Medicine

## 2011-10-02 NOTE — Telephone Encounter (Signed)
Caller: [place]/Mother; PCP: Ruthe Mannan (Nestor Ramp); CB#: [phone]; ; ; Call regarding Cough/Congestion;  Pt was seen in the office on yesterday and started on cough medicine and an antibiotic. She states that cough medicine is not working and was up most of the night coughing. She would like to know if she can add OTC cough medicine to what she she is taking. States Guaifenesin causes her a headache. Per EPIC pt is using Tessalon for cough. Emergent s/s of Cough protocol r/o.

## 2011-10-02 NOTE — Telephone Encounter (Signed)
Yes ok to use OTC cough medicine.

## 2011-10-02 NOTE — Telephone Encounter (Signed)
Left message asking pt to call back. 

## 2011-10-02 NOTE — Telephone Encounter (Signed)
Advised patient

## 2011-10-06 ENCOUNTER — Telehealth: Payer: Self-pay | Admitting: Family Medicine

## 2011-10-06 NOTE — Telephone Encounter (Signed)
Zpack is actually still in her system for several more days.  Try OTC Robitussin and keep [place] posted.   If she has a fever, she needs to be seen.

## 2011-10-06 NOTE — Telephone Encounter (Signed)
Advised pt's husband, he was agreeable.

## 2011-10-06 NOTE — Telephone Encounter (Signed)
Caller: Thomas/Spouse; PCP: Ruthe Mannan (Nestor Ramp); CB#: [phone]; ; ; Call regarding Cough/Congestion;  Afebrile. Husband reports that wife was seen last week and given a Z-pak. She finished the Z-pak on Monday 10/05/11. Husband states cough has not gotten better. It is productive with yellow sputum. Emergent s/s of cough protocol r/o. Wife could not come to phone but husband asked her questions. Pt to see provider within 24 hrs. They are currently out of town and want to know if something else can be called to pharmacy .  [facility] [phone].

## 2011-10-12 ENCOUNTER — Telehealth: Payer: Self-pay | Admitting: Family Medicine

## 2011-10-12 MED ORDER — AZITHROMYCIN 250 MG PO TABS: ORAL_TABLET | ORAL | Status: AC

## 2011-10-12 NOTE — Telephone Encounter (Signed)
Advised patient

## 2011-10-12 NOTE — Telephone Encounter (Signed)
error 

## 2011-10-12 NOTE — Telephone Encounter (Signed)
I spoke with patient.  She is not any better at all- very hoarse with laryngitis.  Advised her that what she has could be viral and antibiotic wont help.  She is asking that another round of antibiotic be called to [facility], in case it is bacterial.  She is also asking for something for cough, but she doesn't want codeine because she cant work and take that.

## 2011-10-12 NOTE — Telephone Encounter (Signed)
Zpack is still in her system and cough and congestion can linger. Would she like prescription strength cough medicine with codeine?   If she is not febrile, I would prefer to hold off on calling in another abx- particularly with her multiple abx allergies.

## 2011-10-12 NOTE — Telephone Encounter (Signed)
Shawna [name]; PCP: Ruthe Mannan (Nestor Ramp); CB#: [phone];  Seen in office on 10/01/11 and started on Z Pack for cough and sinus infectoin. Lost voice/Hoarseness on 10/05/11 and has frequent HARSH Cough and still has some Congestion; She is taking Tessilon pearls and Delsym and not helping. SHE IS REQUESTING ANOTHER ANITBIOTIC BE CALLED INTO [facility]- SHE LEAVES WORK AT 1530 TODAY-10/12/11 AND WOULD LIKE TO PICK UP MEDICATIONS ON WAY HOME. PLEASE CALL BACK AFTER SPEAKING WITH DR. Clifton Custard. Triage and Care Advice per Sore Throat/Hoarseness and Cough Protocols and a Disp to call provider within 4 hours d/t Prior visit for same illness.

## 2011-10-12 NOTE — Telephone Encounter (Signed)
I don't know of anything stronger without codeine then combination of tessalon and robitussin. Continue this.  Will send another zpack to pharmacy.

## 2011-11-09 ENCOUNTER — Other Ambulatory Visit: Payer: Self-pay

## 2011-11-09 MED ORDER — CLOTRIMAZOLE-BETAMETHASONE 1-0.05 % EX CREA: 1.0000 "application " | TOPICAL_CREAM | Freq: Two times a day (BID) | CUTANEOUS | Status: DC

## 2011-11-09 NOTE — Telephone Encounter (Signed)
Pt left v/m requesting refill on lotrisone cream due to hands breaking out again. [facility].Please advise.

## 2012-02-01 ENCOUNTER — Ambulatory Visit (INDEPENDENT_AMBULATORY_CARE_PROVIDER_SITE_OTHER): Payer: Federal, State, Local not specified - PPO | Admitting: Family Medicine

## 2012-02-01 ENCOUNTER — Encounter: Payer: Self-pay | Admitting: Family Medicine

## 2012-02-01 VITALS — BP 106/80 | HR 76 | Temp 98.5°F | Wt 206.0 lb

## 2012-02-01 DIAGNOSIS — R635 Abnormal weight gain: Secondary | ICD-10-CM

## 2012-02-01 LAB — COMPREHENSIVE METABOLIC PANEL
ALT: 33 U/L (ref 0–35)
AST: 45 U/L — ABNORMAL HIGH (ref 0–37)
Alkaline Phosphatase: 90 U/L (ref 39–117)
Calcium: 9.3 mg/dL (ref 8.4–10.5)
Chloride: 106 mEq/L (ref 96–112)
Creatinine, Ser: 0.9 mg/dL (ref 0.4–1.2)
Potassium: 4.5 mEq/L (ref 3.5–5.1)

## 2012-02-01 LAB — HEMOGLOBIN A1C: Hgb A1c MFr Bld: 5.7 % (ref 4.6–6.5)

## 2012-02-01 LAB — T4, FREE: Free T4: 0.83 ng/dL (ref 0.60–1.60)

## 2012-02-01 NOTE — Patient Instructions (Addendum)
Good to see you. We will call you with your lab results.   

## 2012-02-01 NOTE — Addendum Note (Signed)
Addended by: Liane Comber C on: 02/01/2012 01:06 PM   Modules accepted: Orders

## 2012-02-01 NOTE — Progress Notes (Signed)
  Subjective:    Patient ID: Shawna [name], female    DOB: [birth year], 46 y.o.   MRN: [id number]  HPI  46 yo here to discuss weight gain.  Continues to gain weight despite exercises 3-5 times a week, keeping a calorie log.  She wants labs checked to make sure there is nothing else going on.  Her mom had a h/o thyroid dysfunction.  She is always the coldest person in the room, otherwise no symptoms of hyper or hypothyroidism.  Patient Active Problem List  Diagnosis  . TOBACCO ABUSE  . SINUSITIS - ACUTE-NOS  . URI  . HIATAL HERNIA  . UTI'S, CHRONIC  . ECZEMA, HANDS  . Infected pilonidal cyst, s/p I&D  . Weight gain   Past Medical History  Diagnosis Date  . Tobacco abuse   . Family history of breast cancer     mother  . Contact lens/glasses fitting    Past Surgical History  Procedure Date  . Tubal ligation 02/2006    bilateral  . Foot surgery   . Pilonidal cyst excision 1990's, 2012    I&D [id number]   History  Substance Use Topics  . Smoking status: Former Games developer  . Smokeless tobacco: Not on file   Comment: Quit 12/11/10.  Marland Kitchen Alcohol Use: No   Family History  Problem Relation Age of Onset  . Alzheimer's disease Mother   . Cancer Father     lung   Allergies  Allergen Reactions  . Amoxicillin Itching and Rash    Starts at feet and works its way up her body.  . Nitrofurantoin Nausea And Vomiting  . Sulfonamide Derivatives Itching and Rash    Starts at feet and works its way up the body.  . Guaifenesin     REACTION: HA   Current Outpatient Prescriptions on File Prior to Visit  Medication Sig Dispense Refill  . clotrimazole-betamethasone (LOTRISONE) cream Apply 1 application topically 2 (two) times daily.  30 g  0  . Multiple Vitamin (MULTIVITAMIN) tablet Take 1 tablet by mouth daily.         The PMH, PSH, Social History, Family History, Medications, and allergies have been reviewed in [facility], and have been updated if relevant.  Review of Systems See HPI      Objective:   Physical Exam BP 106/80  Pulse 76  Temp 98.5 F (36.9 C)  Wt 206 lb (93.441 kg) Wt Readings from Last 3 Encounters:  02/01/12 206 lb (93.441 kg)  10/01/11 199 lb (90.266 kg)  07/08/11 203 lb (92.08 kg)  Gen:  Alert, pleasant, NAD Psych:  Good eye contact, not anxious or depressed appearing.    Assessment & Plan:   1. Weight gain  TSH, T4, free, Hemoglobin A1c, Comp Met (CMET)   Pt declined referral to nutritionist.  Will check thyroid function, CMET and a1c to rule out other possible contributing factors. Discussed phentermine-risks and benefits.  She wants to continue with diet and exercise first.

## 2012-04-02 ENCOUNTER — Ambulatory Visit (INDEPENDENT_AMBULATORY_CARE_PROVIDER_SITE_OTHER): Payer: Federal, State, Local not specified - PPO | Admitting: Internal Medicine

## 2012-04-02 ENCOUNTER — Encounter: Payer: Self-pay | Admitting: Internal Medicine

## 2012-04-02 VITALS — BP 120/90 | HR 89 | Temp 98.1°F | Wt 206.0 lb

## 2012-04-02 DIAGNOSIS — J069 Acute upper respiratory infection, unspecified: Secondary | ICD-10-CM

## 2012-04-02 DIAGNOSIS — Z880 Allergy status to penicillin: Secondary | ICD-10-CM | POA: Insufficient documentation

## 2012-04-02 DIAGNOSIS — J329 Chronic sinusitis, unspecified: Secondary | ICD-10-CM

## 2012-04-02 MED ORDER — AZITHROMYCIN 250 MG PO TABS: 250.0000 mg | ORAL_TABLET | ORAL | Status: DC

## 2012-04-02 NOTE — Progress Notes (Signed)
Chief Complaint  Patient presents with  . head congestion    sinus infection    HPI: Patient comes in today for SDA for  new problem evaluation.  "Sinus infection" congestion and drainage and ears ring and hard to breath  through nose .   For 4-5 days  Low grade temp at night .  Has tried airborn  .  decongestants usually not helped .  In the past.    mucinex or guif gives headaches .  Gets sinus infections a good bit .  otcs no help so far .  Tobacco stopped  for a year . No allergies .  No asthma. Allergic to pcn rash and swelling  Doesn't remember using keflex to know is has a side effect.  ROS: See pertinent positives and negatives per HPI. No cp sob NVD some sneezing and very congested.   Past Medical History  Diagnosis Date  . Tobacco abuse   . Family history of breast cancer     mother  . Contact lens/glasses fitting     Family History  Problem Relation Age of Onset  . Alzheimer's disease Mother   . Cancer Father     lung    History   Social History  . Marital Status: Married    Spouse Name: N/A    Number of Children: N/A  . Years of Education: N/A   Social History Main Topics  . Smoking status: Former Games developer  . Smokeless tobacco: None     Comment: Quit 12/11/10.  Marland Kitchen Alcohol Use: No  . Drug Use: No  . Sexually Active: None   Other Topics Concern  . None   Social History Narrative  . None    Outpatient Encounter Prescriptions as of 04/02/2012  Medication Sig Dispense Refill  . clotrimazole-betamethasone (LOTRISONE) cream Apply 1 application topically 2 (two) times daily.  30 g  0  . Multiple Vitamin (MULTIVITAMIN) tablet Take 1 tablet by mouth daily.        Marland Kitchen azithromycin (ZITHROMAX Z-PAK) 250 MG tablet Take 1 tablet (250 mg total) by mouth as directed. Take 2 po first day, then 1 po qd  6 each  0    EXAM:  BP 120/90  Pulse 89  Temp 98.1 F (36.7 C) (Oral)  Wt 206 lb (93.441 kg)  SpO2 99%  There is no height on file to calculate BMI.  GENERAL:  vitals reviewed and listed above, alert, oriented, appears well hydrated and in no acute distress very congested   HEENT: Normocephalic ;atraumatic , Eyes;  PERRL, EOMs  Full, lids and conjunctiva clear,,Ears: no deformities, canals nl, TM landmarks normal, Nose: no deformity very congested and frontal tenderness   Mouth : OP clear without lesion or edema . Some redness  Drainage tracts  NECK: no obvious masses on inspection palpation   LUNGS: clear to auscultation bilaterally, no wheezes, rales or rhonchi, good air movement  CV: HRRR, no clubbing cyanosis or  peripheral edema nl cap refill   MS: moves all extremities without noticeable focal  abnormality  PSYCH: pleasant and cooperative, no obvious depression or anxiety  ASSESSMENT AND PLAN:  Discussed the following assessment and plan:  1. Sinusitis    less than 10 days butr sever and not resonding to meds  disc   2. URI, acute   3. Penicillin allergy    I suspect this is viral  But because of severity will rx with antibiotic    Risk benefit of medication discussed.  Also disc decongest ing methods   Expectant management. azithro not that great and has been on this a number of times but hesitant to use cephalos without more info or monitoring  continue tob acco free -Patient advised to return or notify health care team  immediately if symptoms worsen or persist or new concerns arise.  Patient Instructions  This could be a viral sinusitis and resolve on its own . If possible please try pseudophedrine short acting to relieve the pressure  Saline nose spray and  Can add afrin nose spray for  No more than 3 days at a time . Most sinus infection then resolved after 7- 10 days without antibiotics.    However if severe antibiotics can help.   azithomycin can be helpful but   Not first line unless allergic to [facility] which you are .    Can begin antibiotic if not improving after 48 hours on the decongestant plan .    Neta Mends. Panosh  M.D.  Disc how vitamins dont prevent infection  Hand washing avoidance exposure and immuniz is the best. Total visit > 50% spent counseling and coordinating care

## 2012-04-02 NOTE — Patient Instructions (Addendum)
This could be a viral sinusitis and resolve on its own . If possible please try pseudophedrine short acting to relieve the pressure  Saline nose spray and  Can add afrin nose spray for  No more than 3 days at a time . Most sinus infection then resolved after 7- 10 days without antibiotics.    However if severe antibiotics can help.   azithomycin can be helpful but   Not first line unless allergic to [facility] which you are .    Can begin antibiotic if not improving after 48 hours on the decongestant plan .

## 2012-09-02 ENCOUNTER — Telehealth: Payer: Self-pay | Admitting: Family Medicine

## 2012-09-02 MED ORDER — CLOTRIMAZOLE-BETAMETHASONE 1-0.05 % EX CREA: 1.0000 "application " | TOPICAL_CREAM | Freq: Two times a day (BID) | CUTANEOUS | Status: DC

## 2012-09-02 NOTE — Telephone Encounter (Signed)
Left message on voice mail advising patient script has been sent to pharmacy.

## 2012-09-02 NOTE — Telephone Encounter (Signed)
Caller: Saskia/Patient; Phone: [phone]; Reason for Call: Pt calling today 09/02/12 regarding having a flare of eczema on her hands and would like for MD to call in a refill of Lotrisone cream.  Would like this called to [facility] [phone].  Please call pt back at [phone] to let her know this has been done.  Thanks.

## 2012-09-02 NOTE — Telephone Encounter (Signed)
Rx sent 

## 2012-09-27 ENCOUNTER — Telehealth: Payer: Self-pay

## 2012-09-27 MED ORDER — TRIAMCINOLONE ACETONIDE 0.1 % EX CREA: TOPICAL_CREAM | Freq: Two times a day (BID) | CUTANEOUS | Status: AC

## 2012-09-27 NOTE — Telephone Encounter (Signed)
Left message asking patient to call back

## 2012-09-27 NOTE — Telephone Encounter (Signed)
Pt left v/m, no appts available pt has sun poison on legs and forearms; multiple spots and itching. Pt request SSD cream (silver sulfadiazene) or whatever med appropriate sent to [facility].Please advise.

## 2012-09-27 NOTE — Telephone Encounter (Signed)
Advised patient as instructed. 

## 2012-09-27 NOTE — Telephone Encounter (Signed)
Ok to try triamcinolone cream twice daily for next day or so.  Needs to be evaluated.  Please make sure all triage calls like this are not routed to me until Friday.

## 2012-12-28 ENCOUNTER — Other Ambulatory Visit: Payer: Self-pay | Admitting: Obstetrics and Gynecology

## 2012-12-28 DIAGNOSIS — R922 Inconclusive mammogram: Secondary | ICD-10-CM

## 2012-12-29 ENCOUNTER — Telehealth: Payer: Self-pay

## 2012-12-29 DIAGNOSIS — Z0000 Encounter for general adult medical examination without abnormal findings: Secondary | ICD-10-CM

## 2012-12-29 NOTE — Telephone Encounter (Signed)
Pt left v/m; pt may possibly be scheduled for MRI with contrast and GFR and pt wants to know if last labs tested would indicate it is OK for pt to have the contrast.Please advise.

## 2012-12-29 NOTE — Telephone Encounter (Signed)
Patient notified. She also had some questions about whether she needed to be on thyroid meds and why her hands were peeling. I advised these were questions that needed to be addressed in an office visit and offered to make one for her. She said she would call back tomorrow to schedule appt.

## 2012-12-29 NOTE — Telephone Encounter (Signed)
I don't see any recent blood work.  She would need another creatinine prior to MRI with contrast.

## 2012-12-30 NOTE — Addendum Note (Signed)
Addended by: Dianne Dun on: 12/30/2012 09:10 AM   Modules accepted: Orders

## 2012-12-30 NOTE — Telephone Encounter (Signed)
Labs ordered.

## 2012-12-30 NOTE — Telephone Encounter (Signed)
Please order labs for MRI. Thanks!

## 2013-01-02 ENCOUNTER — Other Ambulatory Visit (INDEPENDENT_AMBULATORY_CARE_PROVIDER_SITE_OTHER): Payer: Federal, State, Local not specified - PPO

## 2013-01-02 DIAGNOSIS — Z0000 Encounter for general adult medical examination without abnormal findings: Secondary | ICD-10-CM

## 2013-01-02 LAB — BASIC METABOLIC PANEL
BUN: 15 mg/dL (ref 6–23)
Chloride: 109 mEq/L (ref 96–112)
Creatinine, Ser: 0.8 mg/dL (ref 0.4–1.2)
Glucose, Bld: 93 mg/dL (ref 70–99)

## 2013-01-03 ENCOUNTER — Telehealth: Payer: Self-pay

## 2013-01-03 NOTE — Telephone Encounter (Signed)
Reviewed with pt

## 2013-01-03 NOTE — Telephone Encounter (Signed)
Pt left v/m requesting call back with 01/02/13 lab results when available to [phone].

## 2013-01-03 NOTE — Telephone Encounter (Signed)
Normal kidney function and electrolytes.

## 2013-01-27 ENCOUNTER — Ambulatory Visit
Admission: RE | Admit: 2013-01-27 | Discharge: 2013-01-27 | Disposition: A | Discharge status: Home / Self Care | Payer: Federal, State, Local not specified - PPO | Source: Ambulatory Visit | Attending: Obstetrics and Gynecology | Admitting: Obstetrics and Gynecology

## 2013-01-27 DIAGNOSIS — R922 Inconclusive mammogram: Secondary | ICD-10-CM

## 2013-01-27 MED ORDER — GADOBENATE DIMEGLUMINE 529 MG/ML IV SOLN
15.0000 mL | Freq: Once | INTRAVENOUS | Status: AC | PRN
  Administered 2013-01-27: 15 mL via INTRAVENOUS

## 2013-02-01 ENCOUNTER — Other Ambulatory Visit: Payer: Self-pay | Admitting: Obstetrics and Gynecology

## 2013-02-01 DIAGNOSIS — R928 Other abnormal and inconclusive findings on diagnostic imaging of breast: Secondary | ICD-10-CM

## 2013-02-08 ENCOUNTER — Other Ambulatory Visit: Payer: Self-pay | Admitting: Obstetrics and Gynecology

## 2013-02-08 ENCOUNTER — Ambulatory Visit
Admission: RE | Admit: 2013-02-08 | Discharge: 2013-02-08 | Disposition: A | Discharge status: Home / Self Care | Payer: Federal, State, Local not specified - PPO | Source: Ambulatory Visit | Attending: Obstetrics and Gynecology | Admitting: Obstetrics and Gynecology

## 2013-02-08 DIAGNOSIS — R928 Other abnormal and inconclusive findings on diagnostic imaging of breast: Secondary | ICD-10-CM

## 2013-02-08 MED ORDER — GADOBENATE DIMEGLUMINE 529 MG/ML IV SOLN
15.0000 mL | Freq: Once | INTRAVENOUS | Status: AC | PRN
  Administered 2013-02-08: 15 mL via INTRAVENOUS

## 2013-08-10 ENCOUNTER — Ambulatory Visit (INDEPENDENT_AMBULATORY_CARE_PROVIDER_SITE_OTHER): Payer: Federal, State, Local not specified - PPO | Admitting: Family Medicine

## 2013-08-10 ENCOUNTER — Encounter: Payer: Self-pay | Admitting: Family Medicine

## 2013-08-10 VITALS — BP 128/74 | HR 69 | Temp 97.4°F | Wt 200.0 lb

## 2013-08-10 DIAGNOSIS — J069 Acute upper respiratory infection, unspecified: Secondary | ICD-10-CM

## 2013-08-10 MED ORDER — HYDROCOD POLST-CHLORPHEN POLST 10-8 MG/5ML PO LQCR: 5.0000 mL | Freq: Every evening | ORAL | Status: DC | PRN

## 2013-08-10 MED ORDER — AZITHROMYCIN 250 MG PO TABS: 250.0000 mg | ORAL_TABLET | ORAL | Status: DC

## 2013-08-10 NOTE — Progress Notes (Signed)
Pre visit review using our clinic review tool, if applicable. No additional management support is needed unless otherwise documented below in the visit note. 

## 2013-08-10 NOTE — Progress Notes (Signed)
SUBJECTIVE:  Shawna [name] is a 48 y.o. female who complains of coryza, congestion, sneezing, sore throat, post nasal drip and dry cough for 5 days. She denies a history of anorexia, chest pain, chills, dizziness and myalgias and denies a history of asthma. Patient denies smoke cigarettes.   Patient Active Problem List   Diagnosis Date Noted  . Penicillin allergy 04/02/2012  . Weight gain 02/01/2012  . Infected pilonidal cyst, s/p I&D 11/05/2010  . SINUSITIS - ACUTE-NOS 07/05/2008  . URI 06/02/2007  . HIATAL HERNIA 06/10/2006  . UTI'S, CHRONIC 06/10/2006  . ECZEMA, HANDS 06/10/2006   Past Medical History  Diagnosis Date  . Tobacco abuse   . Family history of breast cancer     mother  . Contact lens/glasses fitting   . TOBACCO ABUSE 06/10/2006    Qualifier: Diagnosis of  By: Jillyn HiddenBean FNP, Mcarthur RossettiBillie-Lynn Daniels    Past Surgical History  Procedure Laterality Date  . Tubal ligation  02/2006    bilateral  . Foot surgery    . Pilonidal cyst excision  1990's, 2012    I&D [id number]   History  Substance Use Topics  . Smoking status: Former Games developermoker  . Smokeless tobacco: Not on file     Comment: Quit 12/11/10.  Marland Kitchen. Alcohol Use: No   Family History  Problem Relation Age of Onset  . Alzheimer's disease Mother   . Cancer Father     lung   Allergies  Allergen Reactions  . Amoxicillin Itching and Rash    Starts at feet and works its way up her body.  . Nitrofurantoin Nausea And Vomiting  . Sulfonamide Derivatives Itching and Rash    Starts at feet and works its way up the body.  . Guaifenesin     REACTION: HA   Current Outpatient Prescriptions on File Prior to Visit  Medication Sig Dispense Refill  . Multiple Vitamin (MULTIVITAMIN) tablet Take 1 tablet by mouth daily.        Marland Kitchen. triamcinolone cream (KENALOG) 0.1 % Apply topically 2 (two) times daily.  30 g  0   No current facility-administered medications on file prior to visit.   The PMH, PSH, Social History, Family History,  Medications, and allergies [facility], and have been updated if relevant.  OBJECTIVE: BP 128/74  Pulse 69  Temp(Src) 97.4 F (36.3 C) (Oral)  Wt 200 lb (90.719 kg)  SpO2 98%  She appears well, vital signs are as noted. Ears normal.  Throat and pharynx normal.  Neck supple. No adenopathy in the neck. Nose is congested. Sinuses non tender. The chest is clear, without wheezes or rales.  ASSESSMENT:  viral upper respiratory illness  PLAN: Symptomatic therapy suggested: push fluids, rest and return office visit prn if symptoms persist or worsen. Lack of antibiotic effectiveness discussed with her but she was insistent that she get an antibiotic- "I will get walking pneumonia."  Explained duration of viral illnesses but gave her rx for zpack to fill (multiple abx allergies) if symptoms worsen. Tussionex rx also given.  Call or return to clinic prn if these symptoms worsen or fail to improve as anticipated.

## 2013-08-10 NOTE — Patient Instructions (Signed)
Good to see you. Let's continue your allergy medication, take tussionex as needed for cough at bedtime.  Call us if you're not better by next week.

## 2013-11-29 ENCOUNTER — Other Ambulatory Visit: Payer: Self-pay | Admitting: Obstetrics and Gynecology

## 2013-11-29 DIAGNOSIS — Z803 Family history of malignant neoplasm of breast: Secondary | ICD-10-CM

## 2013-12-19 ENCOUNTER — Ambulatory Visit
Admission: RE | Admit: 2013-12-19 | Discharge: 2013-12-19 | Disposition: A | Discharge status: Home / Self Care | Payer: Federal, State, Local not specified - PPO | Source: Ambulatory Visit | Attending: Obstetrics and Gynecology | Admitting: Obstetrics and Gynecology

## 2013-12-19 ENCOUNTER — Inpatient Hospital Stay: Admission: RE | Admit: 2013-12-19 | Payer: Federal, State, Local not specified - PPO | Source: Ambulatory Visit

## 2013-12-19 DIAGNOSIS — Z803 Family history of malignant neoplasm of breast: Secondary | ICD-10-CM

## 2013-12-19 MED ORDER — GADOBENATE DIMEGLUMINE 529 MG/ML IV SOLN
17.0000 mL | Freq: Once | INTRAVENOUS | Status: AC | PRN
Start: 2013-12-19 — End: 2013-12-19
  Administered 2013-12-19: 17 mL via INTRAVENOUS

## 2014-04-09 ENCOUNTER — Other Ambulatory Visit: Payer: Self-pay | Admitting: Obstetrics and Gynecology

## 2014-04-10 LAB — CYTOLOGY - PAP

## 2014-12-12 ENCOUNTER — Other Ambulatory Visit: Payer: Self-pay | Admitting: Obstetrics and Gynecology

## 2014-12-12 DIAGNOSIS — Z803 Family history of malignant neoplasm of breast: Secondary | ICD-10-CM

## 2014-12-26 ENCOUNTER — Ambulatory Visit
Admission: RE | Admit: 2014-12-26 | Discharge: 2014-12-26 | Disposition: A | Discharge status: Home / Self Care | Payer: Federal, State, Local not specified - PPO | Source: Ambulatory Visit | Attending: Obstetrics and Gynecology | Admitting: Obstetrics and Gynecology

## 2014-12-26 ENCOUNTER — Other Ambulatory Visit: Payer: Federal, State, Local not specified - PPO

## 2014-12-26 DIAGNOSIS — Z803 Family history of malignant neoplasm of breast: Secondary | ICD-10-CM

## 2014-12-26 MED ORDER — GADOBENATE DIMEGLUMINE 529 MG/ML IV SOLN
17.0000 mL | Freq: Once | INTRAVENOUS | Status: AC | PRN
  Administered 2014-12-26: 17 mL via INTRAVENOUS

## 2015-08-05 ENCOUNTER — Encounter: Payer: Self-pay | Admitting: Gastroenterology

## 2015-08-21 ENCOUNTER — Ambulatory Visit (AMBULATORY_SURGERY_CENTER): Payer: Self-pay

## 2015-08-21 VITALS — Ht 62.5 in | Wt 214.8 lb

## 2015-08-21 DIAGNOSIS — Z1211 Encounter for screening for malignant neoplasm of colon: Secondary | ICD-10-CM

## 2015-08-21 NOTE — Progress Notes (Signed)
No allergies to eggs or soy No past problems with anesthesia No home oxygen No diet meds  Has email and internet; registered for emmi 

## 2015-09-04 ENCOUNTER — Encounter: Payer: Self-pay | Admitting: Gastroenterology

## 2015-09-04 ENCOUNTER — Ambulatory Visit (AMBULATORY_SURGERY_CENTER): Payer: Federal, State, Local not specified - PPO | Admitting: Gastroenterology

## 2015-09-04 VITALS — BP 129/87 | HR 56 | Temp 97.5°F | Resp 11 | Ht 63.0 in | Wt 200.0 lb

## 2015-09-04 DIAGNOSIS — Z1211 Encounter for screening for malignant neoplasm of colon: Secondary | ICD-10-CM

## 2015-09-04 MED ORDER — SODIUM CHLORIDE 0.9 % IV SOLN: 500.0000 mL | INTRAVENOUS | Status: DC

## 2015-09-04 NOTE — Patient Instructions (Signed)
YOU HAD AN ENDOSCOPIC PROCEDURE TODAY AT THE [facility]:   Refer to the procedure report that was given to you for any specific questions about what was found during the examination.  If the procedure report does not answer your questions, please call your gastroenterologist to clarify.  If you requested that your care partner not be given the details of your procedure findings, then the procedure report has been included in a sealed envelope for you to review at your convenience later.  YOU SHOULD EXPECT: Some feelings of bloating in the abdomen. Passage of more gas than usual.  Walking can help get rid of the air that was put into your GI tract during the procedure and reduce the bloating. If you had a lower endoscopy (such as a colonoscopy or flexible sigmoidoscopy) you may notice spotting of blood in your stool or on the toilet paper. If you underwent a bowel prep for your procedure, you may not have a normal bowel movement for a few days.  Please Note:  You might notice some irritation and congestion in your nose or some drainage.  This is from the oxygen used during your procedure.  There is no need for concern and it should clear up in a day or so.  SYMPTOMS TO REPORT IMMEDIATELY:   Following lower endoscopy (colonoscopy or flexible sigmoidoscopy):  Excessive amounts of blood in the stool  Significant tenderness or worsening of abdominal pains  Swelling of the abdomen that is new, acute  Fever of 100F or higher   For urgent or emergent issues, a gastroenterologist can be reached at any hour by calling [phone].   DIET: Your first meal following the procedure should be a small meal and then it is ok to progress to your normal diet. Heavy or fried foods are harder to digest and may make you feel nauseous or bloated.  Likewise, meals heavy in dairy and vegetables can increase bloating.  Drink plenty of fluids but you should avoid alcoholic beverages for 24  hours.  ACTIVITY:  You should plan to take it easy for the rest of today and you should NOT DRIVE or use heavy machinery until tomorrow (because of the sedation medicines used during the test).    FOLLOW UP: Our staff will call the number listed on your records the next business day following your procedure to check on you and address any questions or concerns that you may have regarding the information given to you following your procedure. If we do not reach you, we will leave a message.  However, if you are feeling well and you are not experiencing any problems, there is no need to return our call.  We will assume that you have returned to your regular daily activities without incident.  If any biopsies were taken you will be contacted by phone or by letter within the next 1-3 weeks.  Please call us at [phone] if you have not heard about the biopsies in 3 weeks.    SIGNATURES/CONFIDENTIALITY: You and/or your care partner have signed paperwork which will be entered into your electronic medical record.  These signatures attest to the fact that that the information above on your After Visit Summary has been reviewed and is understood.  Full responsibility of the confidentiality of this discharge information lies with you and/or your care-partner.   Resume medications. 

## 2015-09-04 NOTE — Progress Notes (Signed)
A/ox3, pleased with MAC, report to RN 

## 2015-09-04 NOTE — Op Note (Signed)
[facility] Patient Name: Shawna [name] Procedure Date: 09/04/2015 8:28 AM MRN: [id number] Endoscopist: Sherilyn Cooter L. Myrtie Neither , MD Age: 50 Referring MD:  Date of Birth: 03/28/1966 Gender: Female Procedure:                Colonoscopy Indications:              Screening for colorectal malignant neoplasm, This                            is the patient's first colonoscopy Medicines:                Monitored Anesthesia Care Procedure:                Pre-Anesthesia Assessment:                           - Prior to the procedure, a History and Physical                            was performed, and patient medications and                            allergies were reviewed. The patient's tolerance of                            previous anesthesia was also reviewed. The risks                            and benefits of the procedure and the sedation                            options and risks were discussed with the patient.                            All questions were answered, and informed consent                            was obtained. Prior Anticoagulants: The patient has                            taken no previous anticoagulant or antiplatelet                            agents. ASA Grade Assessment: II - A patient with                            mild systemic disease. After reviewing the risks                            and benefits, the patient was deemed in                            satisfactory condition to undergo the procedure.  After obtaining informed consent, the colonoscope                            was passed under direct vision. Throughout the                            procedure, the patient's blood pressure, pulse, and                            oxygen saturations were monitored continuously. The                            Model CF-HQ190L [id number](SN#[id number]) scope was introduced                            through the anus and advanced to the the  cecum,                            identified by appendiceal orifice and ileocecal                            valve. The colonoscopy was performed without                            difficulty. The patient tolerated the procedure                            well. The quality of the bowel preparation was                            excellent. The ileocecal valve, appendiceal                            orifice, and rectum were photographed. Scope In: 8:37:18 AM Scope Out: 8:51:31 AM Scope Withdrawal Time: 0 hours 10 minutes 37 seconds  Total Procedure Duration: 0 hours 14 minutes 13 seconds  Findings:                 The perianal and digital rectal examinations were                            normal.                           The entire examined colon appeared normal on direct                            and retroflexion views. Complications:            No immediate complications. Estimated Blood Loss:     Estimated blood loss: none. Impression:               - The entire examined colon is normal on direct and                            retroflexion views.                           -  No specimens collected. Recommendation:           - Patient has a contact number available for                            emergencies. The signs and symptoms of potential                            delayed complications were discussed with the                            patient. Return to normal activities tomorrow.                            Written discharge instructions were provided to the                            patient.                           - Resume previous diet.                           - Continue present medications.                           - Repeat colonoscopy in 10 years for screening                            purposes. Dailey Buccheri L. Myrtie Neither, MD 09/04/2015 8:54:56 AM This report has been signed electronically.

## 2015-09-05 ENCOUNTER — Telehealth: Payer: Self-pay

## 2015-09-05 NOTE — Telephone Encounter (Signed)
  Follow up Call-  Call back number 09/04/2015  Post procedure Call Back phone  # [phone]  Permission to leave phone message Yes     Patient questions:  Do you have a fever, pain , or abdominal swelling? No. Pain Score  0 *  Have you tolerated food without any problems? Yes.    Have you been able to return to your normal activities? Yes.    Do you have any questions about your discharge instructions: Diet   No. Medications  No. Follow up visit  No.  Do you have questions or concerns about your Care? No.  Actions: * If pain score is 4 or above: No action needed, pain <4.

## 2015-10-04 ENCOUNTER — Encounter: Payer: Federal, State, Local not specified - PPO | Admitting: Gastroenterology

## 2016-02-04 ENCOUNTER — Other Ambulatory Visit: Payer: Self-pay | Admitting: Obstetrics and Gynecology

## 2016-02-04 DIAGNOSIS — Z803 Family history of malignant neoplasm of breast: Secondary | ICD-10-CM

## 2016-02-25 ENCOUNTER — Ambulatory Visit
Admission: RE | Admit: 2016-02-25 | Discharge: 2016-02-25 | Disposition: A | Discharge status: Home / Self Care | Payer: Federal, State, Local not specified - PPO | Source: Ambulatory Visit | Attending: Obstetrics and Gynecology | Admitting: Obstetrics and Gynecology

## 2016-02-25 DIAGNOSIS — Z803 Family history of malignant neoplasm of breast: Secondary | ICD-10-CM

## 2016-02-25 DIAGNOSIS — Z853 Personal history of malignant neoplasm of breast: Secondary | ICD-10-CM | POA: Diagnosis not present

## 2016-02-25 MED ORDER — GADOBENATE DIMEGLUMINE 529 MG/ML IV SOLN
19.0000 mL | Freq: Once | INTRAVENOUS | Status: AC | PRN
  Administered 2016-02-25: 19 mL via INTRAVENOUS

## 2016-05-12 DIAGNOSIS — Z01419 Encounter for gynecological examination (general) (routine) without abnormal findings: Secondary | ICD-10-CM | POA: Diagnosis not present

## 2016-05-12 DIAGNOSIS — Z6838 Body mass index (BMI) 38.0-38.9, adult: Secondary | ICD-10-CM | POA: Diagnosis not present

## 2016-05-12 LAB — HM PAP SMEAR: HM Pap smear: NEGATIVE

## 2016-05-19 DIAGNOSIS — Z13220 Encounter for screening for lipoid disorders: Secondary | ICD-10-CM | POA: Diagnosis not present

## 2016-05-19 DIAGNOSIS — Z13228 Encounter for screening for other metabolic disorders: Secondary | ICD-10-CM | POA: Diagnosis not present

## 2016-12-08 DIAGNOSIS — Z803 Family history of malignant neoplasm of breast: Secondary | ICD-10-CM | POA: Diagnosis not present

## 2016-12-08 DIAGNOSIS — Z1231 Encounter for screening mammogram for malignant neoplasm of breast: Secondary | ICD-10-CM | POA: Diagnosis not present

## 2017-02-03 ENCOUNTER — Ambulatory Visit (INDEPENDENT_AMBULATORY_CARE_PROVIDER_SITE_OTHER): Payer: Federal, State, Local not specified - PPO | Admitting: Family Medicine

## 2017-02-03 VITALS — BP 118/82 | HR 80 | Temp 97.2°F | Wt 223.8 lb

## 2017-02-03 DIAGNOSIS — J069 Acute upper respiratory infection, unspecified: Secondary | ICD-10-CM | POA: Diagnosis not present

## 2017-02-03 MED ORDER — ALBUTEROL SULFATE HFA 108 (90 BASE) MCG/ACT IN AERS: 2.0000 | INHALATION_SPRAY | Freq: Four times a day (QID) | RESPIRATORY_TRACT | 0 refills | Status: DC | PRN

## 2017-02-03 NOTE — Progress Notes (Signed)
Subjective:    Patient ID: Shawna [name], female    DOB: 1966/02/25, 51 y.o.   MRN: [id number]  HPI This is a 51 yo female who presents today with cough x 1 week. Sputum is clear, using sudafed and flonase, can't tell if it is helping. Using cough drops. Can't take otc cough medicine, it makes her sleepy. No improvement with sudafed. Coughing all night. No SOB or wheeze. Feeling fatigued, no myalgias. Has been taking hycodan cough syrup and Delsym at bedtime without relief. Denies having seasonal allergies, not sure why she is taking flonase.     Past Medical History:  Diagnosis Date  . Contact lens/glasses fitting   . Family history of breast cancer    mother  . Tobacco abuse   . TOBACCO ABUSE 06/10/2006   Qualifier: Diagnosis of  By: Jillyn HiddenBean FNP, Mcarthur RossettiBillie-Lynn Daniels    Past Surgical History:  Procedure Laterality Date  . CERVICAL DISC SURGERY  2013   metal plate and screws  . FOOT SURGERY    . PILONIDAL CYST EXCISION  1990's, 2012   I&D [id number]  . TUBAL LIGATION  02/2006   bilateral   Family History  Problem Relation Age of Onset  . Alzheimer's disease Mother   . Cancer Father        lung  . Colon cancer Neg Hx    Social History  Substance Use Topics  . Smoking status: Former Games developermoker  . Smokeless tobacco: Never Used     Comment: Quit 12/11/10.  Marland Kitchen. Alcohol use Yes     Comment: once monthly      Review of Systems Per HPI    Objective:   Physical Exam  Constitutional: She is oriented to person, place, and time. She appears well-developed and well-nourished. No distress.  HENT:  Head: Normocephalic and atraumatic.  Right Ear: Tympanic membrane, external ear and ear canal normal.  Left Ear: Tympanic membrane, external ear and ear canal normal.  Nose: Mucosal edema present. Right sinus exhibits no maxillary sinus tenderness and no frontal sinus tenderness. Left sinus exhibits no maxillary sinus tenderness and no frontal sinus tenderness.  Mouth/Throat: Oropharynx is  clear and moist.  Eyes: Conjunctivae are normal.  Neck: Normal range of motion. Neck supple.  Cardiovascular: Normal rate, regular rhythm and normal heart sounds.   Pulmonary/Chest: Effort normal and breath sounds normal. She has no wheezes. She has no rales.  Lymphadenopathy:    She has no cervical adenopathy.  Neurological: She is alert and oriented to person, place, and time.  Skin: Skin is warm and dry. She is not diaphoretic.  Psychiatric: She has a normal mood and affect. Her behavior is normal. Judgment and thought content normal.  Vitals reviewed.     BP 118/82 (BP Location: Left Arm, Patient Position: Sitting, Cuff Size: Large)   Pulse 80   Temp (!) 97.2 F (36.2 C) (Oral)   Wt 223 lb 12 oz (101.5 kg)   SpO2 96%   BMI 39.64 kg/m  Wt Readings from Last 3 Encounters:  02/03/17 223 lb 12 oz (101.5 kg)  09/04/15 200 lb (90.7 kg)  08/21/15 214 lb 12.8 oz (97.4 kg)       Assessment & Plan:  1. URI with cough and congestion - suspect viral etiology vs allergic rhinitis - Provided written and verbal information regarding diagnosis and treatment. -  Patient Instructions  Try stopping sudafed and adding daily Zyrtec- generic is fine, can also use Afrin nasal spray twice a  day for up to 4 days   - albuterol (PROVENTIL HFA;VENTOLIN HFA) 108 (90 Base) MCG/ACT inhaler; Inhale 2 puffs into the lungs every 6 (six) hours as needed for wheezing or shortness of breath (cough, shortness of breath or wheezing.).  Dispense: 1 Inhaler; Refill: 0 - RTC precautions reviewed  Olean Ree, FNP-BC  [facility], [facility]  02/03/2017 2:16 PM

## 2017-02-03 NOTE — Patient Instructions (Signed)
Try stopping sudafed and adding daily Zyrtec- generic is fine, can also use Afrin nasal spray twice a day for up to 4 days

## 2017-04-09 DIAGNOSIS — M222X2 Patellofemoral disorders, left knee: Secondary | ICD-10-CM | POA: Diagnosis not present

## 2017-04-09 DIAGNOSIS — R936 Abnormal findings on diagnostic imaging of limbs: Secondary | ICD-10-CM | POA: Diagnosis not present

## 2017-04-09 DIAGNOSIS — M1712 Unilateral primary osteoarthritis, left knee: Secondary | ICD-10-CM | POA: Diagnosis not present

## 2017-04-19 ENCOUNTER — Ambulatory Visit: Payer: Self-pay | Admitting: *Deleted

## 2017-04-19 NOTE — Telephone Encounter (Signed)
Pt  Has   Symptoms  Of  Cough     Congestion      Blood   Tinged    Sputum        Symptoms  X  4  Days        Pt  Drinking  Lots  Of  Water           Trying  otc  Remedies   Still having  Symptoms     Reason for Disposition . [1] Coughed up blood-tinged sputum AND [2] more than once  Answer Assessment - Initial Assessment Questions 1. ONSET: "When did the cough begin?"        4   Days  Ago    2. SEVERITY: "How bad is the cough today?"          Mod   3. RESPIRATORY DISTRESS: "Describe your breathing."          ok 4. FEVER: "Do you have a fever?" If so, ask: "What is your temperature, how was it measured, and when did it start?"         no 5. HEMOPTYSIS: "Are you coughing up any blood?" If so ask: "How much?" (flecks, streaks, tablespoons, etc.)         Flecks   Tint       6. TREATMENT: "What have you done so far to treat the cough?" (e.g., meds, fluids, humidifier)          Cough  Drops  And  Inhaler    Zyrtec   Hydrocodone   Cough  Syrup   7. CARDIAC HISTORY: "Do you have any history of heart disease?" (e.g., heart attack, congestive heart failure)          no 8. LUNG HISTORY: "Do you have any history of lung disease?"  (e.g., pulmonary embolus, asthma, emphysema)         None   9. PE RISK FACTORS: "Do you have a history of blood clots?" (or: recent major surgery, recent prolonged travel, bedridden )        no 10. OTHER SYMPTOMS: "Do you have any other symptoms? (e.g., runny nose, wheezing, chest pain)        Stuffy   11. PREGNANCY: "Is there any chance you are pregnant?" "When was your last menstrual period?"       n/a 12. TRAVEL: "Have you traveled out of the country in the last month?" (e.g., travel history, exposures)       none  Protocols used: COUGH - ACUTE NON-PRODUCTIVE-A-AH

## 2017-04-20 ENCOUNTER — Ambulatory Visit: Payer: Federal, State, Local not specified - PPO | Admitting: Family Medicine

## 2017-04-20 ENCOUNTER — Encounter: Payer: Self-pay | Admitting: Family Medicine

## 2017-04-20 DIAGNOSIS — R05 Cough: Secondary | ICD-10-CM | POA: Diagnosis not present

## 2017-04-20 DIAGNOSIS — R059 Cough, unspecified: Secondary | ICD-10-CM | POA: Insufficient documentation

## 2017-04-20 MED ORDER — HYDROCODONE-HOMATROPINE 5-1.5 MG/5ML PO SYRP
5.0000 mL | ORAL_SOLUTION | Freq: Three times a day (TID) | ORAL | 0 refills | Status: DC | PRN
Start: 2017-04-20 — End: 2017-09-30

## 2017-04-20 MED ORDER — AZITHROMYCIN 250 MG PO TABS: ORAL_TABLET | ORAL | 0 refills | Status: DC

## 2017-04-20 NOTE — Assessment & Plan Note (Signed)
Presumed bronchitis.  Start zithromax, use cough syrup as needed (sedation caution).  Rest and fluids.  Update us as needed.  She agrees with plan, d/w pt.

## 2017-04-20 NOTE — Patient Instructions (Signed)
Presumed bronchitis.  Start zithromax, use the cough syrup as needed (sedation caution).  Rest and fluids.  Update us as needed.  Take care.  Glad to see you.

## 2017-04-20 NOTE — Progress Notes (Signed)
Sx started about 1 week ago.  Husband was sick.  First noted ST.  Then had rhinorrhea, cough.  SOB with coughing fits but not SOB o/w.  Some sputum.  No fevers known.  Some aches noted, arms and legs- she attributed that to being less active.  No vomiting, no diarrhea.  No rash.  No facial pain. Some ear pain prev but not now.  Has used inhaler w/o much relief.  No wheeze.  Tessalon hasn't helped with cough prev.   She had some leftover hydrocodone cough syrup and that helped at night.     Meds, vitals, and allergies reviewed.   ROS: Per HPI unless specifically indicated in ROS section   GEN: nad, alert and oriented HEENT: mucous membranes moist, tm w/o erythema, nasal exam w/o erythema, clear discharge noted,  OP with cobblestoning, sinuses not ttp NECK: supple w/o LA CV: rrr.   PULM: ctab, no inc wob, but cough noted. No wheeze.  EXT: no edema

## 2017-05-13 DIAGNOSIS — Z6839 Body mass index (BMI) 39.0-39.9, adult: Secondary | ICD-10-CM | POA: Diagnosis not present

## 2017-05-13 DIAGNOSIS — Z01419 Encounter for gynecological examination (general) (routine) without abnormal findings: Secondary | ICD-10-CM | POA: Diagnosis not present

## 2017-05-17 DIAGNOSIS — Z13228 Encounter for screening for other metabolic disorders: Secondary | ICD-10-CM | POA: Diagnosis not present

## 2017-05-17 DIAGNOSIS — Z13220 Encounter for screening for lipoid disorders: Secondary | ICD-10-CM | POA: Diagnosis not present

## 2017-05-17 DIAGNOSIS — N912 Amenorrhea, unspecified: Secondary | ICD-10-CM | POA: Diagnosis not present

## 2017-06-21 DIAGNOSIS — Z13228 Encounter for screening for other metabolic disorders: Secondary | ICD-10-CM | POA: Diagnosis not present

## 2017-06-21 DIAGNOSIS — Z1329 Encounter for screening for other suspected endocrine disorder: Secondary | ICD-10-CM | POA: Diagnosis not present

## 2017-06-21 DIAGNOSIS — Z13220 Encounter for screening for lipoid disorders: Secondary | ICD-10-CM | POA: Diagnosis not present

## 2017-06-21 DIAGNOSIS — Z780 Asymptomatic menopausal state: Secondary | ICD-10-CM | POA: Diagnosis not present

## 2017-06-21 LAB — LIPID PANEL
Cholesterol: 163 (ref 0–200)
HDL: 47 (ref 35–70)
LDL Cholesterol: 95
Triglycerides: 110 (ref 40–160)

## 2017-06-21 LAB — HEMOGLOBIN A1C: Hemoglobin A1C: 6.2

## 2017-06-21 LAB — BASIC METABOLIC PANEL
BUN: 19 (ref 4–21)
Creatinine: 0.9 (ref 0.5–1.1)
Glucose: 126
Potassium: 4.2 (ref 3.4–5.3)
SODIUM: 140 (ref 137–147)

## 2017-06-21 LAB — HEPATIC FUNCTION PANEL
ALK PHOS: 145 — AB (ref 25–125)
ALT: 35 (ref 7–35)
AST: 22 (ref 13–35)
Bilirubin, Total: 0.4

## 2017-06-21 LAB — TSH: TSH: 4.58 (ref 0.41–5.90)

## 2017-06-21 LAB — VITAMIN D 25 HYDROXY (VIT D DEFICIENCY, FRACTURES): Vit D, 25-Hydroxy: 28

## 2017-06-22 ENCOUNTER — Other Ambulatory Visit: Payer: Self-pay | Admitting: Orthopedic Surgery

## 2017-06-22 DIAGNOSIS — R52 Pain, unspecified: Secondary | ICD-10-CM

## 2017-06-27 ENCOUNTER — Ambulatory Visit
Admission: RE | Admit: 2017-06-27 | Discharge: 2017-06-27 | Disposition: A | Discharge status: Home / Self Care | Payer: Federal, State, Local not specified - PPO | Source: Ambulatory Visit | Attending: Orthopedic Surgery | Admitting: Orthopedic Surgery

## 2017-06-27 DIAGNOSIS — M25562 Pain in left knee: Secondary | ICD-10-CM | POA: Diagnosis not present

## 2017-06-27 DIAGNOSIS — R52 Pain, unspecified: Secondary | ICD-10-CM

## 2017-07-08 DIAGNOSIS — M1712 Unilateral primary osteoarthritis, left knee: Secondary | ICD-10-CM | POA: Diagnosis not present

## 2017-07-08 DIAGNOSIS — G8929 Other chronic pain: Secondary | ICD-10-CM | POA: Diagnosis not present

## 2017-07-08 DIAGNOSIS — S83242A Other tear of medial meniscus, current injury, left knee, initial encounter: Secondary | ICD-10-CM | POA: Diagnosis not present

## 2017-07-21 DIAGNOSIS — G8918 Other acute postprocedural pain: Secondary | ICD-10-CM | POA: Diagnosis not present

## 2017-07-21 DIAGNOSIS — M1712 Unilateral primary osteoarthritis, left knee: Secondary | ICD-10-CM | POA: Diagnosis not present

## 2017-07-21 DIAGNOSIS — M659 Synovitis and tenosynovitis, unspecified: Secondary | ICD-10-CM | POA: Diagnosis not present

## 2017-07-21 DIAGNOSIS — X58XXXA Exposure to other specified factors, initial encounter: Secondary | ICD-10-CM | POA: Diagnosis not present

## 2017-07-21 DIAGNOSIS — M94262 Chondromalacia, left knee: Secondary | ICD-10-CM | POA: Diagnosis not present

## 2017-07-21 DIAGNOSIS — S83222A Peripheral tear of medial meniscus, current injury, left knee, initial encounter: Secondary | ICD-10-CM | POA: Diagnosis not present

## 2017-07-21 DIAGNOSIS — M6752 Plica syndrome, left knee: Secondary | ICD-10-CM | POA: Diagnosis not present

## 2017-07-21 DIAGNOSIS — Y999 Unspecified external cause status: Secondary | ICD-10-CM | POA: Diagnosis not present

## 2017-07-21 DIAGNOSIS — S83232A Complex tear of medial meniscus, current injury, left knee, initial encounter: Secondary | ICD-10-CM | POA: Diagnosis not present

## 2017-09-30 ENCOUNTER — Ambulatory Visit: Payer: Federal, State, Local not specified - PPO | Admitting: Internal Medicine

## 2017-09-30 ENCOUNTER — Encounter: Payer: Self-pay | Admitting: Internal Medicine

## 2017-09-30 VITALS — BP 122/82 | HR 88 | Temp 98.3°F | Wt 210.0 lb

## 2017-09-30 DIAGNOSIS — R05 Cough: Secondary | ICD-10-CM

## 2017-09-30 DIAGNOSIS — R0982 Postnasal drip: Secondary | ICD-10-CM | POA: Diagnosis not present

## 2017-09-30 DIAGNOSIS — R059 Cough, unspecified: Secondary | ICD-10-CM

## 2017-09-30 MED ORDER — HYDROCOD POLST-CPM POLST ER 10-8 MG/5ML PO SUER: 5.0000 mL | Freq: Every evening | ORAL | 0 refills | Status: DC | PRN

## 2017-09-30 MED ORDER — METHYLPREDNISOLONE ACETATE 80 MG/ML IJ SUSP
80.0000 mg | Freq: Once | INTRAMUSCULAR | Status: AC
  Administered 2017-09-30: 80 mg via INTRAMUSCULAR

## 2017-09-30 NOTE — Addendum Note (Signed)
Addended by: Lorre MunroeBAITY, REGINA W on: 09/30/2017 04:27 PM   Modules accepted: Orders

## 2017-09-30 NOTE — Progress Notes (Signed)
HPI  Pt presents to the clinic today with c/o nasal congestion, sore throat and cough. She reports this started 4-5 days ago. She is not blowing anything out of her nose. She denies difficulty swallowing. The cough is non productive. She denies fever or body aches, but has had chills. She has tried Mucinex withoutany relief. She has no history of allergies or breathing problems. She has had contacts with similar symptoms .  Review of Systems      Past Medical History:  Diagnosis Date  . Contact lens/glasses fitting   . Family history of breast cancer    mother  . Tobacco abuse   . TOBACCO ABUSE 06/10/2006   Qualifier: Diagnosis of  By: Jillyn Hidden FNP, Mcarthur Rossetti     Family History  Problem Relation Age of Onset  . Alzheimer's disease Mother   . Cancer Father        lung  . Colon cancer Neg Hx     Social History   Socioeconomic History  . Marital status: Married    Spouse name: Not on file  . Number of children: Not on file  . Years of education: Not on file  . Highest education level: Not on file  Occupational History  . Not on file  Social Needs  . Financial resource strain: Not on file  . Food insecurity:    Worry: Not on file    Inability: Not on file  . Transportation needs:    Medical: Not on file    Non-medical: Not on file  Tobacco Use  . Smoking status: Former Games developer  . Smokeless tobacco: Never Used  . Tobacco comment: Quit 12/11/10.  Substance and Sexual Activity  . Alcohol use: Yes    Comment: once monthly  . Drug use: No  . Sexual activity: Not on file  Lifestyle  . Physical activity:    Days per week: Not on file    Minutes per session: Not on file  . Stress: Not on file  Relationships  . Social connections:    Talks on phone: Not on file    Gets together: Not on file    Attends religious service: Not on file    Active member of club or organization: Not on file    Attends meetings of clubs or organizations: Not on file    Relationship  status: Not on file  . Intimate partner violence:    Fear of current or ex partner: Not on file    Emotionally abused: Not on file    Physically abused: Not on file    Forced sexual activity: Not on file  Other Topics Concern  . Not on file  Social History Narrative  . Not on file    Allergies  Allergen Reactions  . Amoxicillin Itching and Rash    Starts at feet and works its way up her body.  . Nitrofurantoin Nausea And Vomiting  . Sulfonamide Derivatives Itching and Rash    Starts at feet and works its way up the body.  . Guaifenesin     REACTION: HA     Constitutional: Denies headache, fatigue, fever or  abrupt weight changes.  HEENT:  Positive nasal congestion, sore throat. Denies eye redness, eye pain, pressure behind the eyes, facial pain, nasal congestion, ear pain, ringing in the ears, wax buildup, runny nose or bloody nose. Respiratory: Positive cough. Denies difficulty breathing or shortness of breath.  Cardiovascular: Denies chest pain, chest tightness, palpitations or swelling in the  hands or feet.   No other specific complaints in a complete review of systems (except as listed in HPI above).  Objective:   BP 122/82   Pulse 88   Temp 98.3 F (36.8 C) (Oral)   Wt 210 lb (95.3 kg)   SpO2 99%   BMI 37.20 kg/m    Wt Readings from Last 3 Encounters:  09/30/17 210 lb (95.3 kg)  04/20/17 227 lb 12 oz (103.3 kg)  02/03/17 223 lb 12 oz (101.5 kg)     General: Appears her stated age, in NAD. HEENT: Head: normal shape and size, no sinus tenderness; Ears: Tm's gray and intact, normal light reflex; Nose: mucosa pink and moist, septum midline; Throat/Mouth: + PND. Teeth present, mucosa erythematous and moist, no exudate noted, no lesions or ulcerations noted.  Neck: No cervical lymphadenopathy.  Cardiovascular: Normal rate and rhythm. S1,S2 noted.  No murmur, rubs or gallops noted.  Pulmonary/Chest: Normal effort and positive vesicular breath sounds. No respiratory  distress. No wheezes, rales or ronchi noted.       Assessment & Plan:   Cough secondary to PND:  Get some rest and drink plenty of water Do salt water gargles for the sore throat 80 mg Depo IM today eRx for Tussionex cough syrup  RTC as needed or if symptoms persist.   Shawna Reaperegina Madia Carvell, NP

## 2017-09-30 NOTE — Patient Instructions (Signed)

## 2017-09-30 NOTE — Addendum Note (Signed)
Addended by: Roena MaladyEVONTENNO, MELANIE Y on: 09/30/2017 04:32 PM   Modules accepted: Orders

## 2017-10-14 DIAGNOSIS — S63592A Other specified sprain of left wrist, initial encounter: Secondary | ICD-10-CM | POA: Diagnosis not present

## 2017-10-14 DIAGNOSIS — M25532 Pain in left wrist: Secondary | ICD-10-CM | POA: Diagnosis not present

## 2017-10-14 DIAGNOSIS — Z9181 History of falling: Secondary | ICD-10-CM | POA: Diagnosis not present

## 2017-10-14 DIAGNOSIS — W19XXXA Unspecified fall, initial encounter: Secondary | ICD-10-CM | POA: Diagnosis not present

## 2017-10-19 DIAGNOSIS — M9902 Segmental and somatic dysfunction of thoracic region: Secondary | ICD-10-CM | POA: Diagnosis not present

## 2017-10-19 DIAGNOSIS — M9908 Segmental and somatic dysfunction of rib cage: Secondary | ICD-10-CM | POA: Diagnosis not present

## 2017-10-19 DIAGNOSIS — M531 Cervicobrachial syndrome: Secondary | ICD-10-CM | POA: Diagnosis not present

## 2017-10-22 ENCOUNTER — Encounter: Payer: Self-pay | Admitting: Family Medicine

## 2017-10-22 ENCOUNTER — Ambulatory Visit: Payer: Federal, State, Local not specified - PPO | Admitting: Family Medicine

## 2017-10-22 VITALS — BP 136/74 | HR 88 | Ht 63.0 in

## 2017-10-22 DIAGNOSIS — M546 Pain in thoracic spine: Secondary | ICD-10-CM

## 2017-10-22 DIAGNOSIS — M531 Cervicobrachial syndrome: Secondary | ICD-10-CM | POA: Diagnosis not present

## 2017-10-22 DIAGNOSIS — M9902 Segmental and somatic dysfunction of thoracic region: Secondary | ICD-10-CM | POA: Diagnosis not present

## 2017-10-22 DIAGNOSIS — M9908 Segmental and somatic dysfunction of rib cage: Secondary | ICD-10-CM | POA: Diagnosis not present

## 2017-10-22 MED ORDER — CYCLOBENZAPRINE HCL 10 MG PO TABS: 10.0000 mg | ORAL_TABLET | Freq: Three times a day (TID) | ORAL | 0 refills | Status: DC | PRN

## 2017-10-22 NOTE — Progress Notes (Signed)
Shawna [name] - 52 y.o. female MRN [id number]  Date of birth: 1965/04/15  SUBJECTIVE:  Including CC & ROS.  Chief Complaint  Patient presents with  . Back Pain    Shawna [name] is a 52 y.o. female that is presenting with back pain. Ongoing for three days. Located across her trapezius. Denies tingling or numbness. Denies injury. She recalls doing housework and flipped her mattress. She tripped down her stairs two weeks ago.  She saw her chiropractor on 10/19/17 and this morning, she had an adjustment done. She got a massage yesterday with no improvement. Pain is constant, mild to severe. Difficulty laying down. She has been taking motrin and applying ice.   Review of Systems  Constitutional: Negative for fever.  HENT: Negative for congestion.   Respiratory: Negative for cough.   Cardiovascular: Negative for chest pain.  Gastrointestinal: Negative for abdominal pain.  Musculoskeletal: Positive for back pain.  Skin: Negative for color change.  Neurological: Negative for weakness.  Hematological: Negative for adenopathy.  Psychiatric/Behavioral: Negative for agitation.    HISTORY: Past Medical, Surgical, Social, and Family History Reviewed & Updated per EMR.   Pertinent Historical Findings include:  Past Medical History:  Diagnosis Date  . Contact lens/glasses fitting   . Family history of breast cancer    mother  . Tobacco abuse   . TOBACCO ABUSE 06/10/2006   Qualifier: Diagnosis of  By: Jillyn Hidden FNP, Mcarthur Rossetti     Past Surgical History:  Procedure Laterality Date  . CERVICAL DISC SURGERY  2013   metal plate and screws  . FOOT SURGERY    . PILONIDAL CYST EXCISION  1990's, 2012   I&D [id number]  . TUBAL LIGATION  02/2006   bilateral    Allergies  Allergen Reactions  . Amoxicillin Itching and Rash    Starts at feet and works its way up her body.  . Nitrofurantoin Nausea And Vomiting  . Sulfonamide Derivatives Itching and Rash    Starts at feet and works its way up  the body.  . Guaifenesin     REACTION: HA    Family History  Problem Relation Age of Onset  . Alzheimer's disease Mother   . Cancer Father        lung  . Colon cancer Neg Hx      Social History   Socioeconomic History  . Marital status: Married    Spouse name: Not on file  . Number of children: Not on file  . Years of education: Not on file  . Highest education level: Not on file  Occupational History  . Not on file  Social Needs  . Financial resource strain: Not on file  . Food insecurity:    Worry: Not on file    Inability: Not on file  . Transportation needs:    Medical: Not on file    Non-medical: Not on file  Tobacco Use  . Smoking status: Former Games developer  . Smokeless tobacco: Never Used  . Tobacco comment: Quit 12/11/10.  Substance and Sexual Activity  . Alcohol use: Yes    Comment: once monthly  . Drug use: No  . Sexual activity: Not on file  Lifestyle  . Physical activity:    Days per week: Not on file    Minutes per session: Not on file  . Stress: Not on file  Relationships  . Social connections:    Talks on phone: Not on file    Gets together: Not on file  Attends religious service: Not on file    Active member of club or organization: Not on file    Attends meetings of clubs or organizations: Not on file    Relationship status: Not on file  . Intimate partner violence:    Fear of current or ex partner: Not on file    Emotionally abused: Not on file    Physically abused: Not on file    Forced sexual activity: Not on file  Other Topics Concern  . Not on file  Social History Narrative  . Not on file     PHYSICAL EXAM:  VS: BP 136/74 (BP Location: Left Arm, Patient Position: Sitting, Cuff Size: Normal)   Pulse 88   Ht 5\' 3"  (1.6 m)   SpO2 98%   BMI 37.20 kg/m  Physical Exam Gen: NAD, alert, cooperative with exam, well-appearing ENT: normal lips, normal nasal mucosa,  Eye: normal EOM, normal conjunctiva and lids CV:  no edema, +2 pedal  pulses   Resp: no accessory muscle use, non-labored,  Skin: no rashes, no areas of induration  Neuro: normal tone, normal sensation to touch Psych:  normal insight, alert and oriented MSK:  Back:  TTP of the rhomboids on the left  No midline thoracic tenderness  No winging of the scapula  Normal neck ROM  No TTP of the trapezius  Normal strength to shrug Normal shoulder ROM  Normal grip strength  Neurovascularly intact    Aspiration/Injection Procedure Note Cecilie KicksShelby S Osbourne May 09, 1965  Procedure: Injection Indications: left thoracic pain   Procedure Details Consent: Risks of procedure as well as the alternatives and risks of each were explained to the (patient/caregiver).  Consent for procedure obtained. Time Out: Verified patient identification, verified procedure, site/side was marked, verified correct patient position, special equipment/implants available, medications/allergies/relevent history reviewed, required imaging and test results available.  Performed.  The area was cleaned with iodine and alcohol swabs.    The left rhomboid trigger points was injected using 1 cc's of 40 mg Depomedrol and 4 cc's of 1% lidocaine with a 25 1" needle.  .    A sterile dressing was applied.  Patient did tolerate procedure well.   ASSESSMENT & PLAN:   Acute left-sided thoracic back pain Pain likely muscular in nature. No winging of scapula present. Likely related to posture.  - trigger point injections  - flexeril  - counseled on HEP  - if no improvement consider imaging and PT

## 2017-10-22 NOTE — Patient Instructions (Signed)
Nice to meet you  Please try the exercises  Please try heat on the area  Please follow up with me in 2 weeks or so if the pain isn't improving.

## 2017-10-25 DIAGNOSIS — M9902 Segmental and somatic dysfunction of thoracic region: Secondary | ICD-10-CM | POA: Diagnosis not present

## 2017-10-25 DIAGNOSIS — M531 Cervicobrachial syndrome: Secondary | ICD-10-CM | POA: Diagnosis not present

## 2017-10-25 DIAGNOSIS — M9908 Segmental and somatic dysfunction of rib cage: Secondary | ICD-10-CM | POA: Diagnosis not present

## 2017-10-25 DIAGNOSIS — M546 Pain in thoracic spine: Secondary | ICD-10-CM | POA: Insufficient documentation

## 2017-10-25 NOTE — Assessment & Plan Note (Signed)
Pain likely muscular in nature. No winging of scapula present. Likely related to posture.  - trigger point injections  - flexeril  - counseled on HEP  - if no improvement consider imaging and PT

## 2017-12-10 DIAGNOSIS — Z1231 Encounter for screening mammogram for malignant neoplasm of breast: Secondary | ICD-10-CM | POA: Diagnosis not present

## 2017-12-10 DIAGNOSIS — Z803 Family history of malignant neoplasm of breast: Secondary | ICD-10-CM | POA: Diagnosis not present

## 2017-12-10 LAB — HM MAMMOGRAPHY

## 2017-12-17 DIAGNOSIS — M531 Cervicobrachial syndrome: Secondary | ICD-10-CM | POA: Diagnosis not present

## 2017-12-17 DIAGNOSIS — M9902 Segmental and somatic dysfunction of thoracic region: Secondary | ICD-10-CM | POA: Diagnosis not present

## 2017-12-17 DIAGNOSIS — M9903 Segmental and somatic dysfunction of lumbar region: Secondary | ICD-10-CM | POA: Diagnosis not present

## 2018-04-11 ENCOUNTER — Encounter: Payer: Self-pay | Admitting: Family Medicine

## 2018-04-11 ENCOUNTER — Telehealth: Payer: Self-pay | Admitting: Family Medicine

## 2018-04-11 ENCOUNTER — Ambulatory Visit: Payer: Federal, State, Local not specified - PPO | Admitting: Family Medicine

## 2018-04-11 VITALS — BP 128/92 | HR 104 | Temp 98.4°F | Ht 63.25 in | Wt 211.5 lb

## 2018-04-11 DIAGNOSIS — J0140 Acute pansinusitis, unspecified: Secondary | ICD-10-CM

## 2018-04-11 DIAGNOSIS — R030 Elevated blood-pressure reading, without diagnosis of hypertension: Secondary | ICD-10-CM | POA: Diagnosis not present

## 2018-04-11 MED ORDER — DOXYCYCLINE HYCLATE 100 MG PO TABS: 100.0000 mg | ORAL_TABLET | Freq: Two times a day (BID) | ORAL | 0 refills | Status: AC

## 2018-04-11 NOTE — Telephone Encounter (Signed)
Ok with me 

## 2018-04-11 NOTE — Telephone Encounter (Signed)
Pt is requesting to transfer care due to location from Dr Dayton MartesAron to Dr Selena Battenody. Is it ok to transfer?

## 2018-04-11 NOTE — Progress Notes (Signed)
Subjective:     Shawna [name] is a 53 y.o. female presenting for Sinus Problem (symptoms started on 04/05/18. Symptoms are head pressure, teeth pain, ears pain off and on, post nasal drainage, dry hacking. Has tried Sudafed, Alkelsetzer cold plus.)     Sinus Problem  This is a new problem. The current episode started in the past 7 days. The problem is unchanged. There has been no fever. Associated symptoms include congestion, coughing, ear pain, sinus pressure, sneezing and a sore throat. Pertinent negatives include no headaches. Past treatments include oral decongestants. The treatment provided no relief.     Review of Systems  HENT: Positive for congestion, dental problem (dental pain), ear pain, sinus pressure, sinus pain, sneezing and sore throat.   Respiratory: Positive for cough.   Neurological: Negative for headaches.     Social History   Tobacco Use  Smoking Status Former Smoker  Smokeless Tobacco Never Used  Tobacco Comment   Quit 12/11/10.        Objective:    BP Readings from Last 3 Encounters:  04/11/18 (!) 128/92  10/22/17 136/74  09/30/17 122/82   Wt Readings from Last 3 Encounters:  04/11/18 211 lb 8 oz (95.9 kg)  09/30/17 210 lb (95.3 kg)  04/20/17 227 lb 12 oz (103.3 kg)    BP (!) 128/92   Pulse (!) 104   Temp 98.4 F (36.9 C)   Ht 5' 3.25" (1.607 m)   Wt 211 lb 8 oz (95.9 kg)   SpO2 99%   BMI 37.17 kg/m    Physical Exam Constitutional:      General: She is not in acute distress.    Appearance: She is well-developed. She is not diaphoretic.  HENT:     Head: Normocephalic and atraumatic.     Right Ear: Tympanic membrane and ear canal normal.     Left Ear: Tympanic membrane and ear canal normal.     Nose: Mucosal edema and rhinorrhea present.     Right Sinus: No maxillary sinus tenderness or frontal sinus tenderness.     Left Sinus: No maxillary sinus tenderness or frontal sinus tenderness.     Mouth/Throat:     Pharynx: Uvula  midline. Posterior oropharyngeal erythema present. No oropharyngeal exudate.     Tonsils: Swelling: 0 on the right. 0 on the left.  Eyes:     General: No scleral icterus.    Conjunctiva/sclera: Conjunctivae normal.  Neck:     Musculoskeletal: Neck supple.  Cardiovascular:     Rate and Rhythm: Normal rate and regular rhythm.     Heart sounds: Normal heart sounds. No murmur.  Pulmonary:     Effort: Pulmonary effort is normal. No respiratory distress.     Breath sounds: Normal breath sounds.  Lymphadenopathy:     Cervical: No cervical adenopathy.  Skin:    General: Skin is warm and dry.     Capillary Refill: Capillary refill takes less than 2 seconds.  Neurological:     Mental Status: She is alert.  Psychiatric:        Mood and Affect: Mood normal.           Assessment & Plan:   Problem List Items Addressed This Visit      Respiratory   SINUSITIS - ACUTE-NOS - Primary   Relevant Medications   doxycycline (VIBRA-TABS) 100 MG tablet     Other   Elevated blood pressure reading    BP elevated. May be related to  the cold. Recommend follow-up in 3 months        Symptomatic care  Return in about 3 months (around 07/11/2018) for annual and BP check.  Lynnda Child, MD

## 2018-04-11 NOTE — Telephone Encounter (Signed)
Yes okay with me. I  Don't think I have seen her in almost 5 years.

## 2018-04-11 NOTE — Telephone Encounter (Signed)
Pt was seen today and pcp udated to Dr Selena Batten

## 2018-04-11 NOTE — Patient Instructions (Signed)
  1. Drink plenty of fluids 2. Get lots of rest  Sinus Congestion 1) Neti Pot (Saline rinse) -- 2 times day -- if tolerated 2) Flonase (Store Brand ok) - once daily 3) Over the counter congestion medications  Cough 1) Cough drops can be helpful 2) Nyquil (or nighttime cough medication) 3) Honey is proven to be one of the best cough medications   Sore Throat 1) Honey as above, cough drops 2) Ibuprofen or Aleve can be helpful 3) Salt water Gargles  If you develop fevers (Temperature >100.4), chills, worsening symptoms or symptoms lasting longer than 10 days return to clinic.    

## 2018-04-11 NOTE — Assessment & Plan Note (Signed)
BP elevated. May be related to the cold. Recommend follow-up in 3 months

## 2018-04-14 ENCOUNTER — Encounter: Payer: Self-pay | Admitting: Obstetrics and Gynecology

## 2018-04-14 LAB — FOLLICLE STIMULATING HORMONE: FSH: 97.4

## 2018-04-14 LAB — T3 UPTAKE: T3 Uptake: 26

## 2018-04-14 LAB — T4 (THYROXINE), TOTAL (REFL): T4, Total: 9.1

## 2018-04-14 LAB — FREE THYROXINE INDEX: Index Value: 2.4

## 2018-05-17 DIAGNOSIS — Z6837 Body mass index (BMI) 37.0-37.9, adult: Secondary | ICD-10-CM | POA: Diagnosis not present

## 2018-05-17 DIAGNOSIS — Z01419 Encounter for gynecological examination (general) (routine) without abnormal findings: Secondary | ICD-10-CM | POA: Diagnosis not present

## 2018-05-19 ENCOUNTER — Other Ambulatory Visit: Payer: Self-pay | Admitting: Obstetrics and Gynecology

## 2018-05-19 DIAGNOSIS — Z803 Family history of malignant neoplasm of breast: Secondary | ICD-10-CM

## 2018-05-19 DIAGNOSIS — Z1329 Encounter for screening for other suspected endocrine disorder: Secondary | ICD-10-CM | POA: Diagnosis not present

## 2018-05-19 DIAGNOSIS — Z13220 Encounter for screening for lipoid disorders: Secondary | ICD-10-CM | POA: Diagnosis not present

## 2018-05-19 DIAGNOSIS — Z13228 Encounter for screening for other metabolic disorders: Secondary | ICD-10-CM | POA: Diagnosis not present

## 2018-05-27 DIAGNOSIS — R946 Abnormal results of thyroid function studies: Secondary | ICD-10-CM | POA: Diagnosis not present

## 2018-05-29 ENCOUNTER — Ambulatory Visit
Admission: RE | Admit: 2018-05-29 | Discharge: 2018-05-29 | Disposition: A | Discharge status: Home / Self Care | Payer: Federal, State, Local not specified - PPO | Source: Ambulatory Visit | Attending: Obstetrics and Gynecology | Admitting: Obstetrics and Gynecology

## 2018-05-29 DIAGNOSIS — Z803 Family history of malignant neoplasm of breast: Secondary | ICD-10-CM

## 2018-05-29 DIAGNOSIS — N6489 Other specified disorders of breast: Secondary | ICD-10-CM | POA: Diagnosis not present

## 2018-05-29 MED ORDER — GADOBUTROL 1 MMOL/ML IV SOLN
10.0000 mL | Freq: Once | INTRAVENOUS | Status: AC | PRN
  Administered 2018-05-29: 10 mL via INTRAVENOUS

## 2018-09-08 DIAGNOSIS — E079 Disorder of thyroid, unspecified: Secondary | ICD-10-CM | POA: Diagnosis not present

## 2018-12-15 DIAGNOSIS — Z1231 Encounter for screening mammogram for malignant neoplasm of breast: Secondary | ICD-10-CM | POA: Diagnosis not present

## 2018-12-15 DIAGNOSIS — Z803 Family history of malignant neoplasm of breast: Secondary | ICD-10-CM | POA: Diagnosis not present

## 2019-05-22 DIAGNOSIS — Z1329 Encounter for screening for other suspected endocrine disorder: Secondary | ICD-10-CM | POA: Diagnosis not present

## 2019-05-22 DIAGNOSIS — Z13220 Encounter for screening for lipoid disorders: Secondary | ICD-10-CM | POA: Diagnosis not present

## 2019-05-22 DIAGNOSIS — Z13228 Encounter for screening for other metabolic disorders: Secondary | ICD-10-CM | POA: Diagnosis not present

## 2019-05-22 DIAGNOSIS — R7303 Prediabetes: Secondary | ICD-10-CM | POA: Diagnosis not present

## 2019-05-22 DIAGNOSIS — Z01419 Encounter for gynecological examination (general) (routine) without abnormal findings: Secondary | ICD-10-CM | POA: Diagnosis not present

## 2019-05-22 LAB — HEMOGLOBIN A1C: Hemoglobin A1C: 6

## 2019-05-23 ENCOUNTER — Other Ambulatory Visit: Payer: Self-pay | Admitting: Obstetrics and Gynecology

## 2019-05-23 DIAGNOSIS — Z803 Family history of malignant neoplasm of breast: Secondary | ICD-10-CM

## 2019-05-23 LAB — BASIC METABOLIC PANEL
BUN: 15 (ref 4–21)
CO2: 24 — AB (ref 13–22)
Chloride: 105 (ref 99–108)
Creatinine: 0.9 (ref 0.5–1.1)
Glucose: 109
Potassium: 5 (ref 3.4–5.3)
Sodium: 143 (ref 137–147)

## 2019-05-23 LAB — LIPID PANEL
Cholesterol: 174 (ref 0–200)
HDL: 62 (ref 35–70)
LDL Cholesterol: 92
LDl/HDL Ratio: 1.5
Triglycerides: 113 (ref 40–160)

## 2019-05-23 LAB — COMPREHENSIVE METABOLIC PANEL
Albumin: 4.3 (ref 3.5–5.0)
Calcium: 9.7 (ref 8.7–10.7)

## 2019-05-23 LAB — HEPATIC FUNCTION PANEL
ALT: 24 (ref 7–35)
AST: 18 (ref 13–35)
Alkaline Phosphatase: 164 — AB (ref 25–125)
Bilirubin, Total: 0.4

## 2019-05-23 LAB — T4: Thyroxine (T4): 8.4

## 2019-05-23 LAB — TSH: TSH: 3.35 (ref 0.41–5.90)

## 2019-05-23 LAB — T3 UPTAKE: T3 Uptake: 21

## 2019-05-30 ENCOUNTER — Encounter: Payer: Self-pay | Admitting: Obstetrics and Gynecology

## 2019-06-08 ENCOUNTER — Other Ambulatory Visit: Payer: Self-pay

## 2019-06-08 ENCOUNTER — Ambulatory Visit
Admission: RE | Admit: 2019-06-08 | Discharge: 2019-06-08 | Disposition: A | Discharge status: Home / Self Care | Payer: Federal, State, Local not specified - PPO | Source: Ambulatory Visit | Attending: Obstetrics and Gynecology | Admitting: Obstetrics and Gynecology

## 2019-06-08 DIAGNOSIS — Z803 Family history of malignant neoplasm of breast: Secondary | ICD-10-CM

## 2019-06-08 MED ORDER — GADOBUTROL 1 MMOL/ML IV SOLN
10.0000 mL | Freq: Once | INTRAVENOUS | Status: AC | PRN
  Administered 2019-06-08: 10:00:00 10 mL via INTRAVENOUS

## 2019-10-18 IMAGING — MR MR KNEE*L* W/O CM
4 of 6 series · 19 of 40 positions shown · non-contrast
Comparison: None.

CLINICAL DATA: Left knee pain and swelling for 3 months.

EXAM:
MRI OF THE LEFT KNEE WITHOUT CONTRAST
TECHNIQUE: Multiplanar, multisequence MR imaging of the knee was performed. No
intravenous contrast was administered.

[Series 3: PD fat-sat · axial · 4.0mm · 0.31mm/px · z∈[-52,+53]mm · 7 of 25 slices shown (1 of 4)]
[im 1/25]
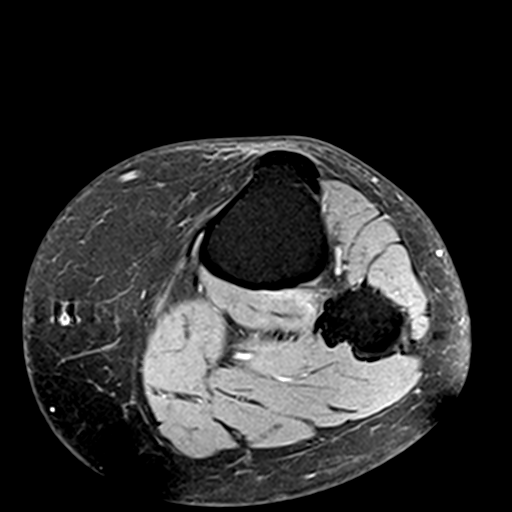
[im 5/25]
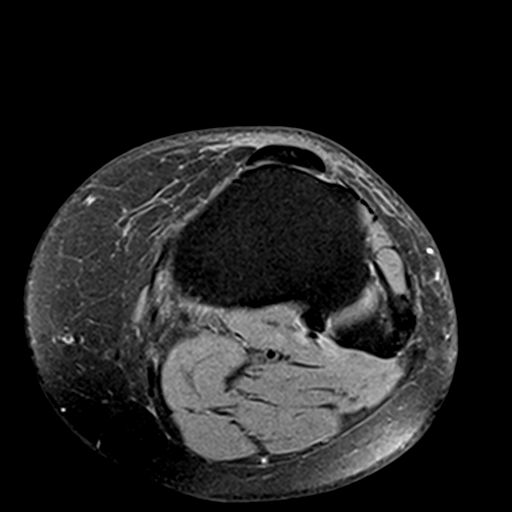
[im 9/25]
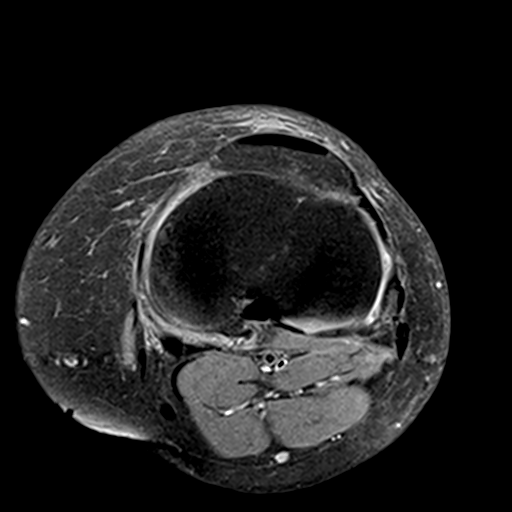
[im 13/25]
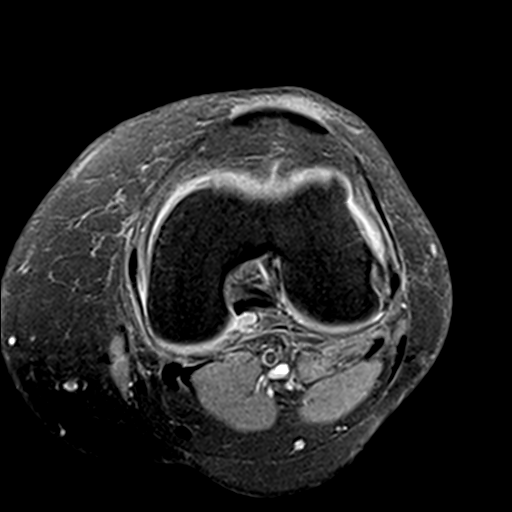
[im 17/25]
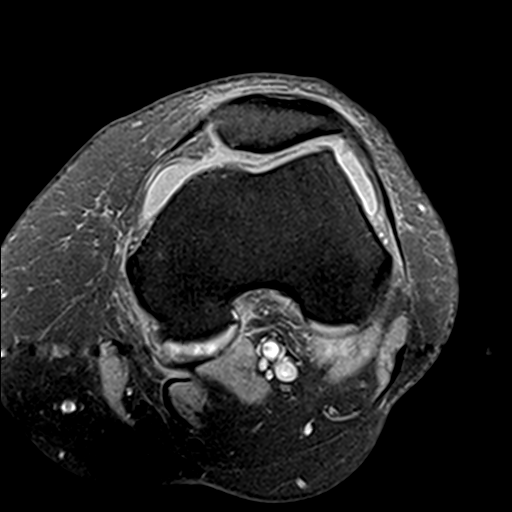
[im 21/25]
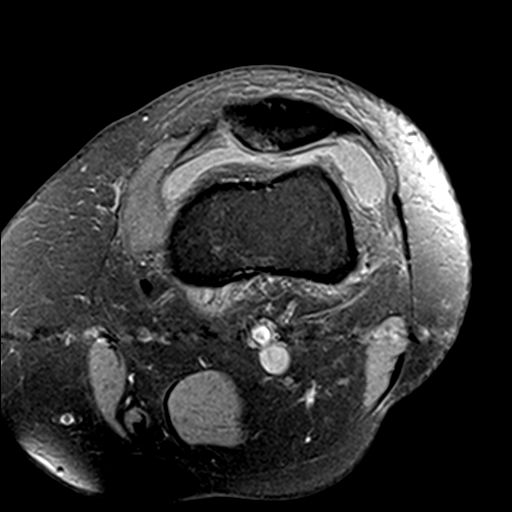
[im 25/25]
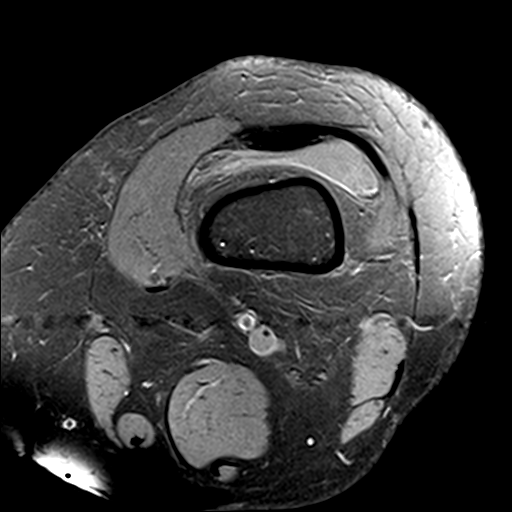

[Series 6: PD fat-sat · sagittal · 4.0mm · 0.31mm/px · 6 of 22 slices shown (2 of 4)]
[im 1/22]
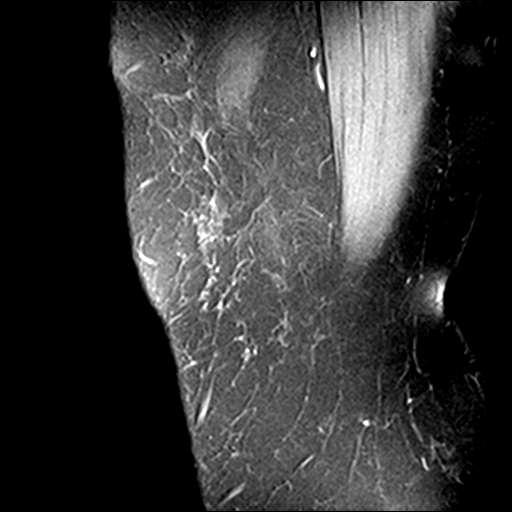
[im 4/22]
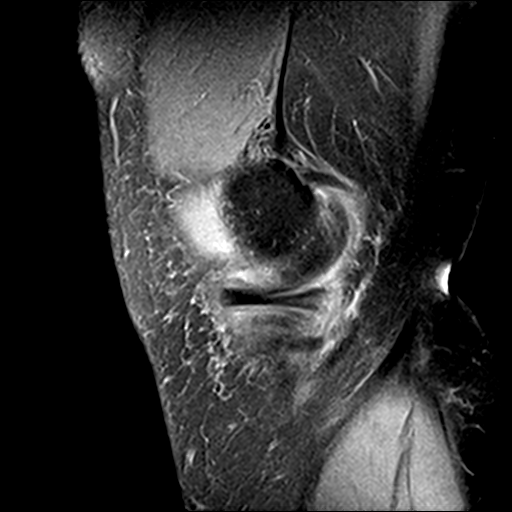
[im 8/22]
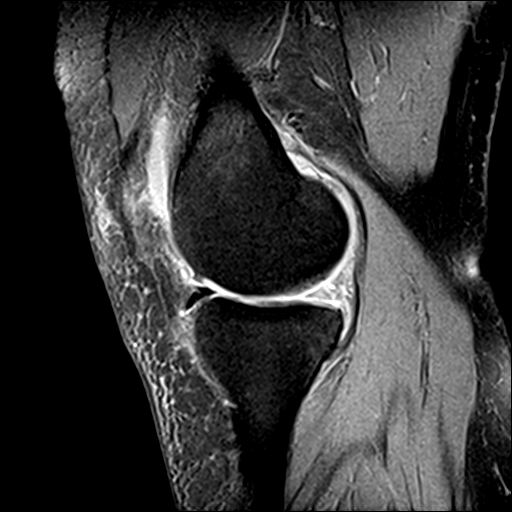
[im 11/22]
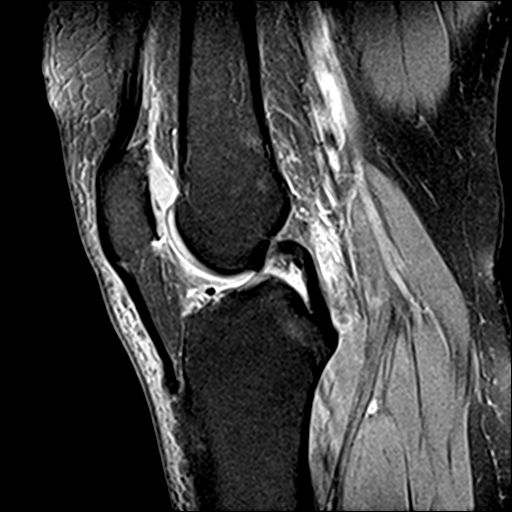
[im 15/22]
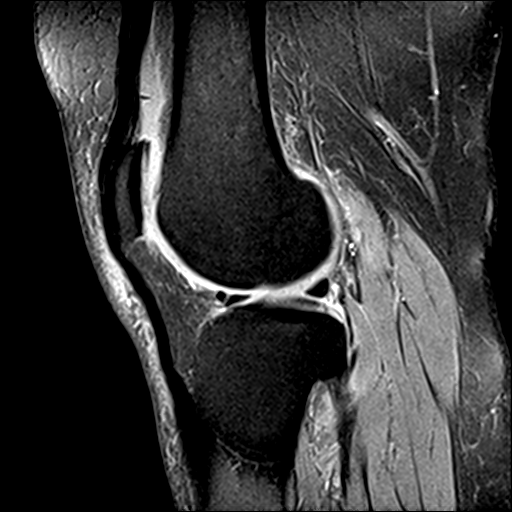
[im 18/22]
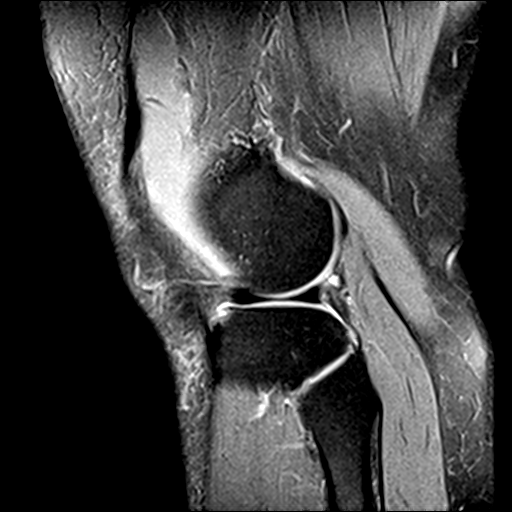

[Series 7: PD fat-sat · coronal · 4.0mm · 0.31mm/px · 3 of 24 slices shown (3 of 4)]
[im 4/24]
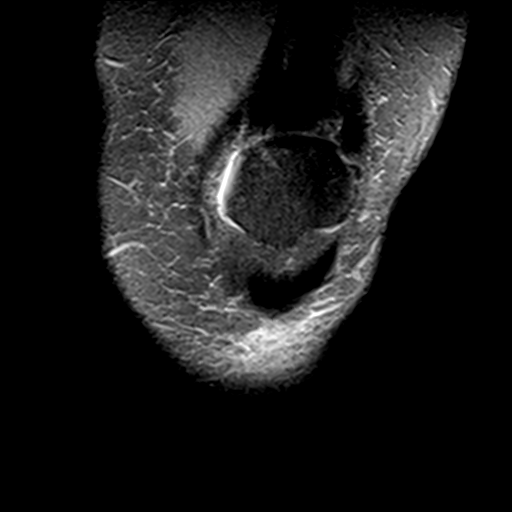
[im 14/24]
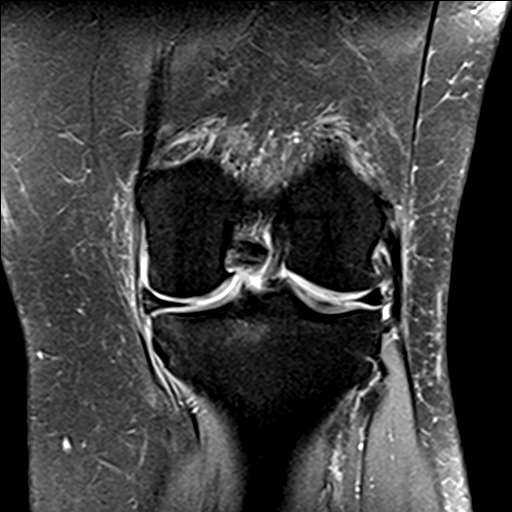
[im 20/24]
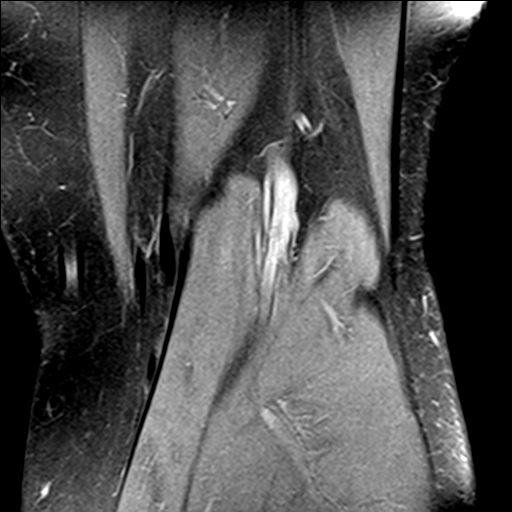

[Series 8: PD fat-sat · coronal · 2.0mm · 0.29mm/px · 3 of 9 slices shown (4 of 4)]
[im 1/9]
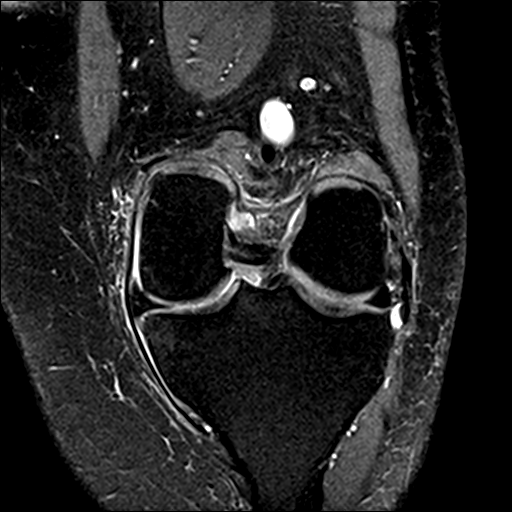
[im 5/9]
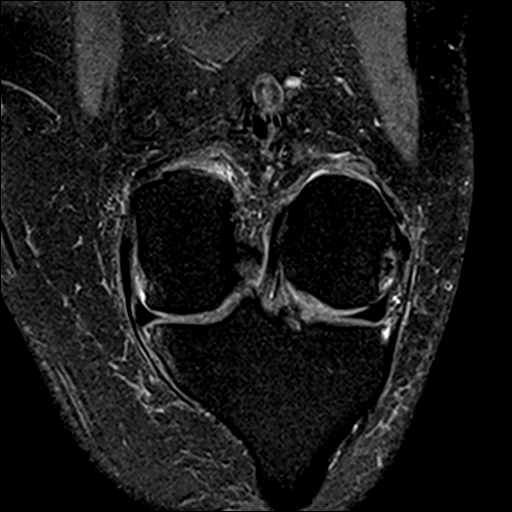
[im 9/9]
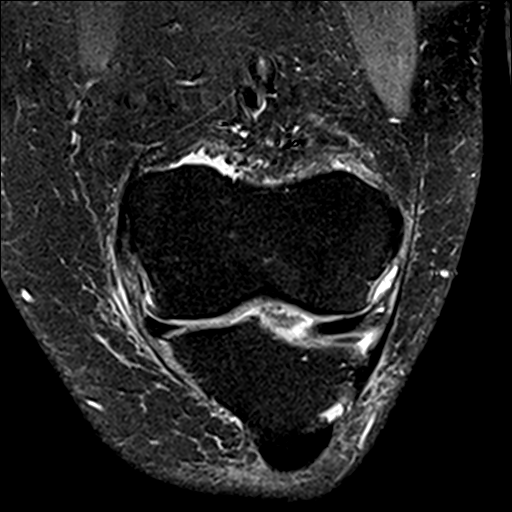

[19 of 40 positions shown; findings below may reference images not displayed]

FINDINGS: MENISCI

Medial meniscus: Radial tear involving the posterior horn near the
meniscal root with associated mild medial protrusion of the meniscus
and typical increased intrasubstance T2 signal abnormality.

Lateral meniscus:  Intact

LIGAMENTS

Cruciates:  Intact

Collaterals:  Intact

CARTILAGE

Patellofemoral: Moderate to advanced degenerative chondrosis mainly
along the patellar apex with areas of full or near full-thickness
cartilage loss.

Medial: Moderate degenerative chondrosis with moderate cartilage
thinning, early joint space narrowing and spurring.

Lateral:  Mild degenerative chondrosis with early spurring.

Joint:  Small to moderate-sized joint effusion.

Popliteal Fossa:  No popliteal mass or Baker's cyst.

Extensor Mechanism: The patella retinacular structures are intact
and the quadriceps and patellar tendons are intact.

Bones:  No acute bony findings.

Other: Normal knee musculature.
IMPRESSION: 1. Posterior horn medial meniscus tear as described above.
2. Intact ligamentous structures and no acute bony findings.
3. Tricompartmental degenerative changes.
4. Small to moderate-sized joint effusion.

## 2019-11-20 DIAGNOSIS — Z20822 Contact with and (suspected) exposure to covid-19: Secondary | ICD-10-CM | POA: Diagnosis not present

## 2019-11-22 ENCOUNTER — Telehealth: Payer: Federal, State, Local not specified - PPO | Admitting: Family

## 2019-11-22 DIAGNOSIS — J069 Acute upper respiratory infection, unspecified: Secondary | ICD-10-CM

## 2019-11-22 MED ORDER — BENZONATATE 100 MG PO CAPS: 100.0000 mg | ORAL_CAPSULE | Freq: Three times a day (TID) | ORAL | 0 refills | Status: DC | PRN

## 2019-11-22 NOTE — Progress Notes (Signed)
We are sorry you are not feeling well.  Here is how we plan to help!  Based on what you have shared with me, it looks like you may have a viral upper respiratory infection.  Upper respiratory infections are caused by a large number of viruses; however, rhinovirus is the most common cause.   Symptoms vary from person to person, with common symptoms including sore throat, cough, fatigue or lack of energy and feeling of general discomfort.  A low-grade fever of up to 100.4 may present, but is often uncommon.  Symptoms vary however, and are closely related to a person's age or underlying illnesses.  The most common symptoms associated with an upper respiratory infection are nasal discharge or congestion, cough, sneezing, headache and pressure in the ears and face.  These symptoms usually persist for about 3 to 10 days, but can last up to 2 weeks.  It is important to know that upper respiratory infections do not cause serious illness or complications in most cases.    Upper respiratory infections can be transmitted from person to person, with the most common method of transmission being a person's hands.  The virus is able to live on the skin and can infect other persons for up to 2 hours after direct contact.  Also, these can be transmitted when someone coughs or sneezes; thus, it is important to cover the mouth to reduce this risk.  To keep the spread of the illness at bay, good hand hygiene is very important.  This is an infection that is most likely caused by a virus. There are no specific treatments other than to help you with the symptoms until the infection runs its course.  We are sorry you are not feeling well.  Here is how we plan to help!   For nasal congestion, you may use an oral decongestants such as Mucinex D or if you have glaucoma or high blood pressure use plain Mucinex.  Saline nasal spray or nasal drops can help and can safely be used as often as needed for congestion.  For your congestion,  continue your  Fluticasone nasal spray one spray in each nostril twice a day  If you do not have a history of heart disease, hypertension, diabetes or thyroid disease, prostate/bladder issues or glaucoma, you may also use Sudafed to treat nasal congestion.  It is highly recommended that you consult with a pharmacist or your primary care physician to ensure this medication is safe for you to take.     If you have a cough, you may use cough suppressants such as Delsym and Robitussin.  If you have glaucoma or high blood pressure, you can also use Coricidin HBP.   For cough I have prescribed for you A prescription cough medication called Tessalon Perles 100 mg. You may take 1-2 capsules every 8 hours as needed for cough  If you have a sore or scratchy throat, use a saltwater gargle-  to  teaspoon of salt dissolved in a 4-ounce to 8-ounce glass of warm water.  Gargle the solution for approximately 15-30 seconds and then spit.  It is important not to swallow the solution.  You can also use throat lozenges/cough drops and Chloraseptic spray to help with throat pain or discomfort.  Warm or cold liquids can also be helpful in relieving throat pain.  For headache, pain or general discomfort, you can use Ibuprofen or Tylenol as directed.   Some authorities believe that zinc sprays or the use of  Echinacea may shorten the course of your symptoms.   HOME CARE . Only take medications as instructed by your medical team. . Be sure to drink plenty of fluids. Water is fine as well as fruit juices, sodas and electrolyte beverages. You may want to stay away from caffeine or alcohol. If you are nauseated, try taking small sips of liquids. How do you know if you are getting enough fluid? Your urine should be a pale yellow or almost colorless. . Get rest. . Taking a steamy shower or using a humidifier may help nasal congestion and ease sore throat pain. You can place a towel over your head and breathe in the steam from  hot water coming from a faucet. . Using a saline nasal spray works much the same way. . Cough drops, hard candies and sore throat lozenges may ease your cough. . Avoid close contacts especially the very young and the elderly . Cover your mouth if you cough or sneeze . Always remember to wash your hands.   GET HELP RIGHT AWAY IF: . You develop worsening fever. . If your symptoms do not improve within 10 days . You develop yellow or green discharge from your nose over 3 days. . You have coughing fits . You develop a severe head ache or visual changes. . You develop shortness of breath, difficulty breathing or start having chest pain . Your symptoms persist after you have completed your treatment plan  MAKE SURE YOU   Understand these instructions.  Will watch your condition.  Will get help right away if you are not doing well or get worse.  Your e-visit answers were reviewed by a board certified advanced clinical practitioner to complete your personal care plan. Depending upon the condition, your plan could have included both over the counter or prescription medications. Please review your pharmacy choice. If there is a problem, you may call our nursing hot line at and have the prescription routed to another pharmacy. Your safety is important to [place]. If you have drug allergies check your prescription carefully.   You can use MyChart to ask questions about today's visit, request a non-urgent call back, or ask for a work or school excuse for 24 hours related to this e-Visit. If it has been greater than 24 hours you will need to follow up with your provider, or enter a new e-Visit to address those concerns. You will get an e-mail in the next two days asking about your experience.  I hope that your e-visit has been valuable and will speed your recovery. Thank you for using e-visits.  Approximately 5 minutes was spent documenting and reviewing patient's chart.

## 2019-11-23 ENCOUNTER — Telehealth (INDEPENDENT_AMBULATORY_CARE_PROVIDER_SITE_OTHER): Payer: Federal, State, Local not specified - PPO | Admitting: Family Medicine

## 2019-11-23 ENCOUNTER — Encounter: Payer: Self-pay | Admitting: Family Medicine

## 2019-11-23 ENCOUNTER — Other Ambulatory Visit: Payer: Self-pay

## 2019-11-23 VITALS — Temp 97.6°F | Ht 63.5 in | Wt 198.0 lb

## 2019-11-23 DIAGNOSIS — J040 Acute laryngitis: Secondary | ICD-10-CM

## 2019-11-23 MED ORDER — DOXYCYCLINE HYCLATE 100 MG PO TABS: 100.0000 mg | ORAL_TABLET | Freq: Two times a day (BID) | ORAL | 0 refills | Status: AC

## 2019-11-23 NOTE — Progress Notes (Signed)
I connected with Shawna [name] on 11/23/19 at  4:20 PM EDT by video and verified that I am speaking with the correct person using two identifiers.   I discussed the limitations, risks, security and privacy concerns of performing an evaluation and management service by video and the availability of in person appointments. I also discussed with the patient that there may be a patient responsible charge related to this service. The patient expressed understanding and agreed to proceed.  Patient location: Home Provider Location: [facility] Participants: Lynnda Child and Shawna [name]   Subjective:     Shawna [name] is a 54 y.o. female presenting for URI (x 5 days )     URI  This is a new problem. The current episode started in the past 7 days. The problem has been gradually worsening. There has been no fever. Associated symptoms include congestion, coughing, diarrhea (chronic issues occasionally) and a sore throat (voice loss). Pertinent negatives include no nausea, rhinorrhea, sinus pain or vomiting. She has tried decongestant (flonase) for the symptoms. The treatment provided mild relief.   Feels good in the morning but losing her voice by the afternoon No known exposure  No loss of smell or taste  Will get a burning sensation in the mouth or tongue  Has flight travel arrangements on Monday  Has not been vaccinate against covid  Review of Systems  Constitutional: Positive for chills and diaphoresis. Negative for fever.  HENT: Positive for congestion, postnasal drip, sore throat (voice loss) and voice change. Negative for rhinorrhea, sinus pressure and sinus pain.        Dry sinuses  Eyes: Negative for itching.  Respiratory: Positive for cough. Negative for shortness of breath.   Gastrointestinal: Positive for diarrhea (chronic issues occasionally). Negative for nausea and vomiting.     Social History   Tobacco Use  Smoking Status Former Smoker    Smokeless Tobacco Never Used  Tobacco Comment   Quit 12/11/10.        Objective:   BP Readings from Last 3 Encounters:  04/11/18 (!) 128/92  10/22/17 136/74  09/30/17 122/82   Wt Readings from Last 3 Encounters:  11/23/19 198 lb (89.8 kg)  04/11/18 211 lb 8 oz (95.9 kg)  09/30/17 210 lb (95.3 kg)     Temp 97.6 F (36.4 C) (Oral)   Ht 5' 3.5" (1.613 m)   Wt 198 lb (89.8 kg)   BMI 34.52 kg/m    Physical Exam Constitutional:      Appearance: Normal appearance. She is not ill-appearing.  HENT:     Head: Normocephalic and atraumatic.     Right Ear: External ear normal.     Left Ear: External ear normal.     Nose: Congestion present.     Mouth/Throat:     Comments: Hoarse voice Eyes:     Conjunctiva/sclera: Conjunctivae normal.  Pulmonary:     Effort: Pulmonary effort is normal. No respiratory distress.     Comments: Occasional cough Neurological:     Mental Status: She is alert. Mental status is at baseline.  Psychiatric:        Mood and Affect: Mood normal.        Behavior: Behavior normal.        Thought Content: Thought content normal.        Judgment: Judgment normal.           Assessment & Plan:   Problem List Items Addressed  This Visit    None    Visit Diagnoses    Acute laryngitis    -  Primary   Relevant Medications   doxycycline (VIBRA-TABS) 100 MG tablet     Discussed likely viral and given no known covid exposures ok to continue to monitor. Advised repeat testing if new symptoms (loss of taste/smell)  Symptomatic care Abx if worsening ER precautions discussed   Return if symptoms worsen or fail to improve.  Lynnda Child, MD

## 2019-12-20 DIAGNOSIS — Z1231 Encounter for screening mammogram for malignant neoplasm of breast: Secondary | ICD-10-CM | POA: Diagnosis not present

## 2020-04-02 ENCOUNTER — Other Ambulatory Visit: Payer: Self-pay | Admitting: Family Medicine

## 2020-04-02 ENCOUNTER — Other Ambulatory Visit: Payer: Self-pay

## 2020-04-02 ENCOUNTER — Other Ambulatory Visit (INDEPENDENT_AMBULATORY_CARE_PROVIDER_SITE_OTHER): Payer: Federal, State, Local not specified - PPO

## 2020-04-02 ENCOUNTER — Telehealth: Payer: Self-pay | Admitting: Family Medicine

## 2020-04-02 ENCOUNTER — Telehealth (INDEPENDENT_AMBULATORY_CARE_PROVIDER_SITE_OTHER): Payer: Federal, State, Local not specified - PPO | Admitting: Internal Medicine

## 2020-04-02 ENCOUNTER — Other Ambulatory Visit: Payer: Self-pay | Admitting: Internal Medicine

## 2020-04-02 ENCOUNTER — Encounter: Payer: Self-pay | Admitting: Internal Medicine

## 2020-04-02 VITALS — Ht 63.5 in | Wt 198.0 lb

## 2020-04-02 DIAGNOSIS — R0981 Nasal congestion: Secondary | ICD-10-CM | POA: Diagnosis not present

## 2020-04-02 DIAGNOSIS — H9203 Otalgia, bilateral: Secondary | ICD-10-CM | POA: Diagnosis not present

## 2020-04-02 DIAGNOSIS — R059 Cough, unspecified: Secondary | ICD-10-CM

## 2020-04-02 DIAGNOSIS — Z20822 Contact with and (suspected) exposure to covid-19: Secondary | ICD-10-CM | POA: Diagnosis not present

## 2020-04-02 LAB — POC INFLUENZA A&B (BINAX/QUICKVUE)
Influenza A, POC: NEGATIVE
Influenza B, POC: NEGATIVE

## 2020-04-02 MED ORDER — HYDROCOD POLST-CPM POLST ER 10-8 MG/5ML PO SUER: 5.0000 mL | Freq: Every evening | ORAL | 0 refills | Status: DC | PRN

## 2020-04-02 NOTE — Progress Notes (Signed)
Virtual Visit via Video Note  I connected with Shawna [name] on 04/02/20 at 11:30 AM EST by a video enabled telemedicine application and verified that I am speaking with the correct person using two identifiers.  Location: Patient: Home Provider: Office  Persons participating in this video call: Nicki Reaper, NP-C and Kavitha Lansdale  I discussed the limitations of evaluation and management by telemedicine and the availability of in person appointments. The patient expressed understanding and agreed to proceed.  History of Present Illness:  Pt reports nasal congestion, ear pain, cough. This started  3-4 days ago. She is not blowing anything out of her nose.  She denies drainage from the ear, or loss of hearing.  The cough is dry and non productive.  The cough is keeping her up at night.  She denies headache, runny nose, sore throat loss of taste/smell or SOB.  She denies fever, chills or body aches.  She has not taken anything OTC for this. She does not smoke. She has had exposure to Covid. She has had her Covid vaccine.    Past Medical History:  Diagnosis Date  . Contact lens/glasses fitting   . Family history of breast cancer    mother  . Tobacco abuse   . TOBACCO ABUSE 06/10/2006   Qualifier: Diagnosis of  By: Jillyn Hidden FNP, Mcarthur Rossetti     Current Outpatient Medications  Medication Sig Dispense Refill  . chlorpheniramine-HYDROcodone (TUSSIONEX PENNKINETIC ER) 10-8 MG/5ML SUER Take 5 mLs by mouth at bedtime as needed. 140 mL 0  . Dextromethorphan-guaiFENesin (MUCINEX DM PO) Take by mouth as needed.    . triamcinolone cream (KENALOG) 0.1 % Apply topically 2 (two) times daily. (Patient taking differently: Apply topically as needed.) 30 g 0   No current facility-administered medications for this visit.    Allergies  Allergen Reactions  . Amoxicillin Itching and Rash    Starts at feet and works its way up her body.  . Nitrofurantoin Nausea And Vomiting  . Sulfonamide  Derivatives Itching and Rash    Starts at feet and works its way up the body.  . Guaifenesin     REACTION: HA    Family History  Problem Relation Age of Onset  . Alzheimer's disease Mother   . Breast cancer Mother 12  . Lung cancer Father        smoker  . Cervical cancer Maternal Grandmother   . Prostate cancer Maternal Grandfather   . Colon cancer Neg Hx     Social History   Socioeconomic History  . Marital status: Married    Spouse name: Orvilla Fus  . Number of children: Not on file  . Years of education: some college  . Highest education level: Not on file  Occupational History  . Not on file  Tobacco Use  . Smoking status: Former Games developer  . Smokeless tobacco: Never Used  . Tobacco comment: Quit 12/11/10.  Substance and Sexual Activity  . Alcohol use: Yes    Comment: once monthly  . Drug use: No  . Sexual activity: Yes    Birth control/protection: Surgical  Other Topics Concern  . Not on file  Social History Narrative  . Not on file   Social Determinants of Health   Financial Resource Strain: Not on file  Food Insecurity: Not on file  Transportation Needs: Not on file  Physical Activity: Not on file  Stress: Not on file  Social Connections: Not on file  Intimate Partner Violence: Not on file  Constitutional: Denies fever, malaise, fatigue, headache or abrupt weight changes.  HEENT: Patient reports ear pain and nasal congestion.  Denies eye pain, eye redness, ringing in the ears, wax buildup, runny nose, bloody nose, or sore throat. Respiratory: Patient reports cough.  Denies difficulty breathing, shortness of breath, or sputum production.   Cardiovascular: Denies chest pain, chest tightness, palpitations or swelling in the hands or feet.    No other specific complaints in a complete review of systems (except as listed in HPI above).    Observations/Objective:  Ht 5' 3.5" (1.613 m)   Wt 198 lb (89.8 kg)   BMI 34.52 kg/m  Wt Readings from Last 3  Encounters:  04/02/20 198 lb (89.8 kg)  11/23/19 198 lb (89.8 kg)  04/11/18 211 lb 8 oz (95.9 kg)    General: Appears her stated age, well developed, well nourished in NAD. HEENT:  Nose: Nasal congestion noted; Throat/Mouth: Hoarseness noted.  Pulmonary/Chest: Normal effort. No respiratory distress. Cough noted. Neurological: Alert and oriented.    BMET    Component Value Date/Time   NA 143 05/23/2019 0000   K 5.0 05/23/2019 0000   CL 105 05/23/2019 0000   CO2 24 (A) 05/23/2019 0000   GLUCOSE 93 01/02/2013 0739   BUN 15 05/23/2019 0000   CREATININE 0.9 05/23/2019 0000   CREATININE 0.8 01/02/2013 0739   CALCIUM 9.7 05/23/2019 0000    Lipid Panel     Component Value Date/Time   CHOL 174 05/23/2019 0000   TRIG 113 05/23/2019 0000   HDL 62 05/23/2019 0000   LDLCALC 92 05/23/2019 0000    CBC No results found for: WBC, RBC, HGB, HCT, PLT, MCV, MCH, MCHC, RDW, LYMPHSABS, MONOABS, EOSABS, BASOSABS  Hgb A1C Lab Results  Component Value Date   HGBA1C 6.0 05/22/2019       Assessment and Plan:  Exposure to COVID-19:  We will have her schedule nurse visit for influenza A/B and COVID-19 testing Recommend Flonase 1 spray each nostril Recommend Vitamin C and Zinc Rx for Tussionex for cough Encourage masking, social distancing, frequent handwashing and self quarantine until symptoms resolve because results are back  We will follow-up after labs, return/ER precautions discussed Follow Up Instructions:    I discussed the assessment and treatment plan with the patient. The patient was provided an opportunity to ask questions and all were answered. The patient agreed with the plan and demonstrated an understanding of the instructions.   The patient was advised to call back or seek an in-person evaluation if the symptoms worsen or if the condition fails to improve as anticipated.    Nicki Reaper, NP

## 2020-04-02 NOTE — Telephone Encounter (Signed)
Patient said she received a call from [place]. Not sure who it was. Patient was returning the call. EM

## 2020-04-02 NOTE — Patient Instructions (Signed)
COVID-19 COVID-19 is a respiratory infection that is caused by a virus called severe acute respiratory syndrome coronavirus 2 (SARS-CoV-2). The disease is also known as coronavirus disease or novel coronavirus. In some people, the virus may not cause any symptoms. In others, it may cause a serious infection. The infection can get worse quickly and can lead to complications, such as:  Pneumonia, or infection of the lungs.  Acute respiratory distress syndrome or ARDS. This is a condition in which fluid build-up in the lungs prevents the lungs from filling with air and passing oxygen into the blood.  Acute respiratory failure. This is a condition in which there is not enough oxygen passing from the lungs to the body or when carbon dioxide is not passing from the lungs out of the body.  Sepsis or septic shock. This is a serious bodily reaction to an infection.  Blood clotting problems.  Secondary infections due to bacteria or fungus.  Organ failure. This is when your body's organs stop working. The virus that causes COVID-19 is contagious. This means that it can spread from person to person through droplets from coughs and sneezes (respiratory secretions). What are the causes? This illness is caused by a virus. You may catch the virus by:  Breathing in droplets from an infected person. Droplets can be spread by a person breathing, speaking, singing, coughing, or sneezing.  Touching something, like a table or a doorknob, that was exposed to the virus (contaminated) and then touching your mouth, nose, or eyes. What increases the risk? Risk for infection You are more likely to be infected with this virus if you:  Are within 6 feet (2 meters) of a person with COVID-19.  Provide care for or live with a person who is infected with COVID-19.  Spend time in crowded indoor spaces or live in shared housing. Risk for serious illness You are more likely to become seriously ill from the virus if you:   Are 50 years of age or older. The higher your age, the more you are at risk for serious illness.  Live in a nursing home or long-term care facility.  Have cancer.  Have a long-term (chronic) disease such as: ? Chronic lung disease, including chronic obstructive pulmonary disease or asthma. ? A long-term disease that lowers your body's ability to fight infection (immunocompromised). ? Heart disease, including heart failure, a condition in which the arteries that lead to the heart become narrow or blocked (coronary artery disease), a disease which makes the heart muscle thick, weak, or stiff (cardiomyopathy). ? Diabetes. ? Chronic kidney disease. ? Sickle cell disease, a condition in which red blood cells have an abnormal "sickle" shape. ? Liver disease.  Are obese. What are the signs or symptoms? Symptoms of this condition can range from mild to severe. Symptoms may appear any time from 2 to 14 days after being exposed to the virus. They include:  A fever or chills.  A cough.  Difficulty breathing.  Headaches, body aches, or muscle aches.  Runny or stuffy (congested) nose.  A sore throat.  New loss of taste or smell. Some people may also have stomach problems, such as nausea, vomiting, or diarrhea. Other people may not have any symptoms of COVID-19. How is this diagnosed? This condition may be diagnosed based on:  Your signs and symptoms, especially if: ? You live in an area with a COVID-19 outbreak. ? You recently traveled to or from an area where the virus is common. ? You   provide care for or live with a person who was diagnosed with COVID-19. ? You were exposed to a person who was diagnosed with COVID-19.  A physical exam.  Lab tests, which may include: ? Taking a sample of fluid from the back of your nose and throat (nasopharyngeal fluid), your nose, or your throat using a swab. ? A sample of mucus from your lungs (sputum). ? Blood tests.  Imaging tests, which  may include, X-rays, CT scan, or ultrasound. How is this treated? At present, there is no medicine to treat COVID-19. Medicines that treat other diseases are being used on a trial basis to see if they are effective against COVID-19. Your health care provider will talk with you about ways to treat your symptoms. For most people, the infection is mild and can be managed at home with rest, fluids, and over-the-counter medicines. Treatment for a serious infection usually takes places in a hospital intensive care unit (ICU). It may include one or more of the following treatments. These treatments are given until your symptoms improve.  Receiving fluids and medicines through an IV.  Supplemental oxygen. Extra oxygen is given through a tube in the nose, a face mask, or a hood.  Positioning you to lie on your stomach (prone position). This makes it easier for oxygen to get into the lungs.  Continuous positive airway pressure (CPAP) or bi-level positive airway pressure (BPAP) machine. This treatment uses mild air pressure to keep the airways open. A tube that is connected to a motor delivers oxygen to the body.  Ventilator. This treatment moves air into and out of the lungs by using a tube that is placed in your windpipe.  Tracheostomy. This is a procedure to create a hole in the neck so that a breathing tube can be inserted.  Extracorporeal membrane oxygenation (ECMO). This procedure gives the lungs a chance to recover by taking over the functions of the heart and lungs. It supplies oxygen to the body and removes carbon dioxide. Follow these instructions at home: Lifestyle  If you are sick, stay home except to get medical care. Your health care provider will tell you how long to stay home. Call your health care provider before you go for medical care.  Rest at home as told by your health care provider.  Do not use any products that contain nicotine or tobacco, such as cigarettes, e-cigarettes, and  chewing tobacco. If you need help quitting, ask your health care provider.  Return to your normal activities as told by your health care provider. Ask your health care provider what activities are safe for you. General instructions  Take over-the-counter and prescription medicines only as told by your health care provider.  Drink enough fluid to keep your urine pale yellow.  Keep all follow-up visits as told by your health care provider. This is important. How is this prevented?  There is no vaccine to help prevent COVID-19 infection. However, there are steps you can take to protect yourself and others from this virus. To protect yourself:   Do not travel to areas where COVID-19 is a risk. The areas where COVID-19 is reported change often. To identify high-risk areas and travel restrictions, check the CDC travel website: wwwnc.cdc.gov/travel/notices  If you live in, or must travel to, an area where COVID-19 is a risk, take precautions to avoid infection. ? Stay away from people who are sick. ? Wash your hands often with soap and water for 20 seconds. If soap and water   are not available, use an alcohol-based hand sanitizer. ? Avoid touching your mouth, face, eyes, or nose. ? Avoid going out in public, follow guidance from your state and local health authorities. ? If you must go out in public, wear a cloth face covering or face mask. Make sure your mask covers your nose and mouth. ? Avoid crowded indoor spaces. Stay at least 6 feet (2 meters) away from others. ? Disinfect objects and surfaces that are frequently touched every day. This may include:  Counters and tables.  Doorknobs and light switches.  Sinks and faucets.  Electronics, such as phones, remote controls, keyboards, computers, and tablets. To protect others: If you have symptoms of COVID-19, take steps to prevent the virus from spreading to others.  If you think you have a COVID-19 infection, contact your health care  provider right away. Tell your health care team that you think you may have a COVID-19 infection.  Stay home. Leave your house only to seek medical care. Do not use public transport.  Do not travel while you are sick.  Wash your hands often with soap and water for 20 seconds. If soap and water are not available, use alcohol-based hand sanitizer.  Stay away from other members of your household. Let healthy household members care for children and pets, if possible. If you have to care for children or pets, wash your hands often and wear a mask. If possible, stay in your own room, separate from others. Use a different bathroom.  Make sure that all people in your household wash their hands well and often.  Cough or sneeze into a tissue or your sleeve or elbow. Do not cough or sneeze into your hand or into the air.  Wear a cloth face covering or face mask. Make sure your mask covers your nose and mouth. Where to find more information  Centers for Disease Control and Prevention: www.cdc.gov/coronavirus/2019-ncov/index.html  World Health Organization: www.who.int/health-topics/coronavirus Contact a health care provider if:  You live in or have traveled to an area where COVID-19 is a risk and you have symptoms of the infection.  You have had contact with someone who has COVID-19 and you have symptoms of the infection. Get help right away if:  You have trouble breathing.  You have pain or pressure in your chest.  You have confusion.  You have bluish lips and fingernails.  You have difficulty waking from sleep.  You have symptoms that get worse. These symptoms may represent a serious problem that is an emergency. Do not wait to see if the symptoms will go away. Get medical help right away. Call your local emergency services (911 in the [place] Do not drive yourself to the hospital. Let the emergency medical personnel know if you think you have COVID-19. Summary  COVID-19 is a  respiratory infection that is caused by a virus. It is also known as coronavirus disease or novel coronavirus. It can cause serious infections, such as pneumonia, acute respiratory distress syndrome, acute respiratory failure, or sepsis.  The virus that causes COVID-19 is contagious. This means that it can spread from person to person through droplets from breathing, speaking, singing, coughing, or sneezing.  You are more likely to develop a serious illness if you are 50 years of age or older, have a weak immune system, live in a nursing home, or have chronic disease.  There is no medicine to treat COVID-19. Your health care provider will talk with you about ways to treat your symptoms.    Take steps to protect yourself and others from infection. Wash your hands often and disinfect objects and surfaces that are frequently touched every day. Stay away from people who are sick and wear a mask if you are sick. This information is not intended to replace advice given to you by your health care provider. Make sure you discuss any questions you have with your health care provider. Document Revised: 01/20/2019 Document Reviewed: 04/28/2018 Elsevier Patient Education  2020 [employer]  

## 2020-04-03 LAB — SARS-COV-2, NAA 2 DAY TAT

## 2020-04-03 LAB — NOVEL CORONAVIRUS, NAA: SARS-CoV-2, NAA: DETECTED — AB

## 2020-04-04 ENCOUNTER — Encounter: Payer: Self-pay | Admitting: Internal Medicine

## 2020-04-04 NOTE — Telephone Encounter (Addendum)
Pt left VM at Triage f/u on her mychart asking if her and her husband qualify for the abx infusion, will route to PCP and Nicki Reaper, NP who saw them

## 2020-04-08 ENCOUNTER — Telehealth: Payer: Self-pay | Admitting: *Deleted

## 2020-04-08 NOTE — Telephone Encounter (Signed)
I called pt to get more information  About what medications she is referring to... When pt answered I told her who I was and told her I was calling to get more information on the medication she was referring to while speaking to triage nurse. Pt immediately said, "I already spoke to a nurse about this", pt was snapping with an annoyed tone. I went on to ask patient if she was having any new or worsening Sx... pt stated that she is not experiencing any new or worsening Sx, she is just still coughing... Pt stated she knew of someone who was prescribed Hydroxychloroquine and she is doing a lot better after 2 days... Pt advised that our providers do not prescribed that type of medication... pt proceeding to complain of how "We do not care about her and that she continues to cough" I told patient we absolutely care for her well being and want to do what is best for her but there is no indication for Ax or steroids per Regina's comments... Also advised of how long a cough can last after having any virus and may be linger following Covid. Offered pt Tessalon as she reported that she cannot take Rx cough syrup during the day, pt declined and stated "She will figure it out". I wished pt well and that I hope she feels better soon, pt hung up telephone

## 2020-04-08 NOTE — Telephone Encounter (Signed)
Not sure if she is looking for steroids or abx, but that was not indicated at the time of her visit. We could give her tessalon for her cough. Any new or worsening symptoms? She can take Vit C and Zinc OTC.

## 2020-04-08 NOTE — Telephone Encounter (Signed)
Patient called stating that she tested positive for covid last week and did a virtual visit with Nicki Reaper NP last week. Patient stated that she is coughing her head off. Patient stated that she was given cough medication and can not take it during the day when she is trying to work. Patient stated that she is working from home. Patient denies fever, SOB or difficulty breathing. Patient stated that she is aware of some doctors giving more medications for covid patients than what she was given.  Patient wants to know what other medications she can be given to help her get over covid faster? ER precautions were given to patient and she verbalized understanding. Pharmacy [facility]

## 2020-05-22 DIAGNOSIS — Z01419 Encounter for gynecological examination (general) (routine) without abnormal findings: Secondary | ICD-10-CM | POA: Diagnosis not present

## 2020-05-27 DIAGNOSIS — Z13220 Encounter for screening for lipoid disorders: Secondary | ICD-10-CM | POA: Diagnosis not present

## 2020-05-27 DIAGNOSIS — Z13228 Encounter for screening for other metabolic disorders: Secondary | ICD-10-CM | POA: Diagnosis not present

## 2020-05-28 LAB — LIPID PANEL
Cholesterol: 174 (ref 0–200)
HDL: 58 (ref 35–70)
LDL Cholesterol: 99
Triglycerides: 94 (ref 40–160)

## 2020-07-04 DIAGNOSIS — R7309 Other abnormal glucose: Secondary | ICD-10-CM | POA: Diagnosis not present

## 2020-07-05 LAB — BASIC METABOLIC PANEL
BUN: 19 (ref 4–21)
CO2: 21 (ref 13–22)
Chloride: 104 (ref 99–108)
Creatinine: 0.8 (ref 0.5–1.1)
Glucose: 115
Potassium: 4.5 (ref 3.4–5.3)
Sodium: 141 (ref 137–147)

## 2020-07-05 LAB — HEPATIC FUNCTION PANEL
ALT: 34 (ref 7–35)
AST: 25 (ref 13–35)
Alkaline Phosphatase: 153 — AB (ref 25–125)
Bilirubin, Total: 0.5

## 2020-07-05 LAB — COMPREHENSIVE METABOLIC PANEL
Albumin: 4.3 (ref 3.5–5.0)
Calcium: 9.4 (ref 8.7–10.7)
Globulin: 2.4

## 2020-07-05 LAB — HEMOGLOBIN A1C: Hemoglobin A1C: 6.2

## 2020-07-23 DIAGNOSIS — Z1321 Encounter for screening for nutritional disorder: Secondary | ICD-10-CM | POA: Diagnosis not present

## 2020-07-23 DIAGNOSIS — E559 Vitamin D deficiency, unspecified: Secondary | ICD-10-CM | POA: Diagnosis not present

## 2020-07-23 DIAGNOSIS — Z8639 Personal history of other endocrine, nutritional and metabolic disease: Secondary | ICD-10-CM | POA: Diagnosis not present

## 2020-07-23 DIAGNOSIS — E663 Overweight: Secondary | ICD-10-CM | POA: Diagnosis not present

## 2020-07-23 DIAGNOSIS — E538 Deficiency of other specified B group vitamins: Secondary | ICD-10-CM | POA: Diagnosis not present

## 2020-07-23 DIAGNOSIS — R7303 Prediabetes: Secondary | ICD-10-CM | POA: Diagnosis not present

## 2020-07-23 DIAGNOSIS — Z780 Asymptomatic menopausal state: Secondary | ICD-10-CM | POA: Diagnosis not present

## 2020-07-23 DIAGNOSIS — Z1329 Encounter for screening for other suspected endocrine disorder: Secondary | ICD-10-CM | POA: Diagnosis not present

## 2020-09-12 DIAGNOSIS — R7303 Prediabetes: Secondary | ICD-10-CM | POA: Diagnosis not present

## 2020-09-12 DIAGNOSIS — Z780 Asymptomatic menopausal state: Secondary | ICD-10-CM | POA: Diagnosis not present

## 2020-09-12 DIAGNOSIS — E663 Overweight: Secondary | ICD-10-CM | POA: Diagnosis not present

## 2020-09-12 DIAGNOSIS — E039 Hypothyroidism, unspecified: Secondary | ICD-10-CM | POA: Diagnosis not present

## 2020-09-19 DIAGNOSIS — L2089 Other atopic dermatitis: Secondary | ICD-10-CM | POA: Diagnosis not present

## 2020-09-19 DIAGNOSIS — L821 Other seborrheic keratosis: Secondary | ICD-10-CM | POA: Diagnosis not present

## 2020-09-19 DIAGNOSIS — D2372 Other benign neoplasm of skin of left lower limb, including hip: Secondary | ICD-10-CM | POA: Diagnosis not present

## 2020-09-19 DIAGNOSIS — L820 Inflamed seborrheic keratosis: Secondary | ICD-10-CM | POA: Diagnosis not present

## 2020-09-19 DIAGNOSIS — D225 Melanocytic nevi of trunk: Secondary | ICD-10-CM | POA: Diagnosis not present

## 2020-11-01 ENCOUNTER — Encounter: Payer: Self-pay | Admitting: Obstetrics and Gynecology

## 2020-12-02 DIAGNOSIS — R5383 Other fatigue: Secondary | ICD-10-CM | POA: Diagnosis not present

## 2020-12-02 DIAGNOSIS — R7303 Prediabetes: Secondary | ICD-10-CM | POA: Diagnosis not present

## 2020-12-02 DIAGNOSIS — E663 Overweight: Secondary | ICD-10-CM | POA: Diagnosis not present

## 2020-12-02 DIAGNOSIS — E559 Vitamin D deficiency, unspecified: Secondary | ICD-10-CM | POA: Diagnosis not present

## 2020-12-02 DIAGNOSIS — Z780 Asymptomatic menopausal state: Secondary | ICD-10-CM | POA: Diagnosis not present

## 2020-12-02 DIAGNOSIS — E039 Hypothyroidism, unspecified: Secondary | ICD-10-CM | POA: Diagnosis not present

## 2020-12-02 DIAGNOSIS — Z131 Encounter for screening for diabetes mellitus: Secondary | ICD-10-CM | POA: Diagnosis not present

## 2020-12-02 DIAGNOSIS — E538 Deficiency of other specified B group vitamins: Secondary | ICD-10-CM | POA: Diagnosis not present

## 2020-12-16 DIAGNOSIS — E039 Hypothyroidism, unspecified: Secondary | ICD-10-CM | POA: Diagnosis not present

## 2020-12-16 DIAGNOSIS — Z780 Asymptomatic menopausal state: Secondary | ICD-10-CM | POA: Diagnosis not present

## 2020-12-16 DIAGNOSIS — R5383 Other fatigue: Secondary | ICD-10-CM | POA: Diagnosis not present

## 2020-12-16 DIAGNOSIS — R7303 Prediabetes: Secondary | ICD-10-CM | POA: Diagnosis not present

## 2020-12-25 DIAGNOSIS — Z1231 Encounter for screening mammogram for malignant neoplasm of breast: Secondary | ICD-10-CM | POA: Diagnosis not present

## 2021-03-07 DIAGNOSIS — R7989 Other specified abnormal findings of blood chemistry: Secondary | ICD-10-CM | POA: Diagnosis not present

## 2021-03-07 DIAGNOSIS — R7303 Prediabetes: Secondary | ICD-10-CM | POA: Diagnosis not present

## 2021-03-07 DIAGNOSIS — E559 Vitamin D deficiency, unspecified: Secondary | ICD-10-CM | POA: Diagnosis not present

## 2021-03-07 DIAGNOSIS — R5383 Other fatigue: Secondary | ICD-10-CM | POA: Diagnosis not present

## 2021-03-07 DIAGNOSIS — R748 Abnormal levels of other serum enzymes: Secondary | ICD-10-CM | POA: Diagnosis not present

## 2021-03-07 DIAGNOSIS — Z780 Asymptomatic menopausal state: Secondary | ICD-10-CM | POA: Diagnosis not present

## 2021-03-07 DIAGNOSIS — E039 Hypothyroidism, unspecified: Secondary | ICD-10-CM | POA: Diagnosis not present

## 2021-03-07 DIAGNOSIS — E538 Deficiency of other specified B group vitamins: Secondary | ICD-10-CM | POA: Diagnosis not present

## 2021-03-21 DIAGNOSIS — E039 Hypothyroidism, unspecified: Secondary | ICD-10-CM | POA: Diagnosis not present

## 2021-03-21 DIAGNOSIS — R7989 Other specified abnormal findings of blood chemistry: Secondary | ICD-10-CM | POA: Diagnosis not present

## 2021-03-21 DIAGNOSIS — R790 Abnormal level of blood mineral: Secondary | ICD-10-CM | POA: Diagnosis not present

## 2021-03-21 DIAGNOSIS — E538 Deficiency of other specified B group vitamins: Secondary | ICD-10-CM | POA: Diagnosis not present

## 2021-09-24 ENCOUNTER — Ambulatory Visit
Admission: EM | Admit: 2021-09-24 | Discharge: 2021-09-24 | Disposition: A | Discharge status: Home / Self Care | Payer: Federal, State, Local not specified - PPO | Attending: Urgent Care | Admitting: Urgent Care

## 2021-09-24 ENCOUNTER — Ambulatory Visit (INDEPENDENT_AMBULATORY_CARE_PROVIDER_SITE_OTHER): Payer: Federal, State, Local not specified - PPO

## 2021-09-24 DIAGNOSIS — R053 Chronic cough: Secondary | ICD-10-CM

## 2021-09-24 DIAGNOSIS — J209 Acute bronchitis, unspecified: Secondary | ICD-10-CM

## 2021-09-24 DIAGNOSIS — R0602 Shortness of breath: Secondary | ICD-10-CM | POA: Diagnosis not present

## 2021-09-24 MED ORDER — PREDNISONE 50 MG PO TABS: 50.0000 mg | ORAL_TABLET | Freq: Every day | ORAL | 0 refills | Status: DC

## 2021-09-24 MED ORDER — PROMETHAZINE-DM 6.25-15 MG/5ML PO SYRP: 2.5000 mL | ORAL_SOLUTION | Freq: Four times a day (QID) | ORAL | 0 refills | Status: DC | PRN

## 2021-09-24 NOTE — ED Triage Notes (Signed)
Patient presents to Urgent Care with complaints of a dry cough x 2 weeks. Treating symptoms with delsum and alka-seltzer with no improvement.   Denies fever.

## 2021-09-24 NOTE — ED Provider Notes (Signed)
Shawna [name]   MRN: [id number] DOB: [birth year]  Subjective:   Shawna [name] is a 56 y.o. female presenting for 2-week history of persistent hacking cough.  Has also had intermittent ringing of the ears, shortness of breath.  Has been using Delsym and Alka-Seltzer without any relief.  No overt fevers, chest pains, body aches.  Patient quit smoking 15 years ago.  No history of respiratory disorders to the best of her knowledge.  No current facility-administered medications for this encounter.  Current Outpatient Medications:    chlorpheniramine-HYDROcodone (TUSSIONEX PENNKINETIC ER) 10-8 MG/5ML SUER, Take 5 mLs by mouth at bedtime as needed., Disp: 140 mL, Rfl: 0   Dextromethorphan-guaiFENesin (MUCINEX DM PO), Take by mouth as needed., Disp: , Rfl:    triamcinolone cream (KENALOG) 0.1 %, Apply topically 2 (two) times daily. (Patient taking differently: Apply topically as needed.), Disp: 30 g, Rfl: 0   Allergies  Allergen Reactions   Amoxicillin Itching and Rash    Starts at feet and works its way up her body.   Nitrofurantoin Nausea And Vomiting   Sulfonamide Derivatives Itching and Rash    Starts at feet and works its way up the body.   Guaifenesin     REACTION: HA    Past Medical History:  Diagnosis Date   Contact lens/glasses fitting    Family history of breast cancer    mother   Tobacco abuse    TOBACCO ABUSE 06/10/2006   Qualifier: Diagnosis of  By: Jillyn Hidden FNP, Mcarthur [name]      Past Surgical History:  Procedure Laterality Date   CERVICAL DISC SURGERY  2013   metal plate and screws   FOOT SURGERY     PILONIDAL CYST EXCISION  1990's, 2012   I&D [id number]   TUBAL LIGATION  02/2006   bilateral    Family History  Problem Relation Age of Onset   Alzheimer's disease Mother    Breast cancer Mother 18   Lung cancer Father        smoker   Cervical cancer Maternal Grandmother    Prostate cancer Maternal Grandfather    Colon cancer Neg Hx      Social History   Tobacco Use   Smoking status: Former   Smokeless tobacco: Never   Tobacco comments:    Quit 12/11/10.  Substance Use Topics   Alcohol use: Yes    Comment: once monthly   Drug use: No    ROS   Objective:   Vitals: BP 134/85   Pulse 94   Temp 98.2 F (36.8 C)   Resp 18   SpO2 98%   Physical Exam Constitutional:      General: She is not in acute distress.    Appearance: Normal appearance. She is well-developed and normal weight. She is not ill-appearing, toxic-appearing or diaphoretic.  HENT:     Head: Normocephalic and atraumatic.     Right Ear: Tympanic membrane, ear canal and external ear normal. No drainage or tenderness. No middle ear effusion. There is no impacted cerumen. Tympanic membrane is not erythematous.     Left Ear: Tympanic membrane, ear canal and external ear normal. No drainage or tenderness.  No middle ear effusion. There is no impacted cerumen. Tympanic membrane is not erythematous.     Ears:     Comments: TMs opacified bilaterally.    Nose: Nose normal. No congestion or rhinorrhea.     Mouth/Throat:     Mouth: Mucous membranes are moist. No oral  lesions.     Pharynx: No pharyngeal swelling, oropharyngeal exudate, posterior oropharyngeal erythema or uvula swelling.     Tonsils: No tonsillar exudate or tonsillar abscesses.  Eyes:     General: No scleral icterus.       Right eye: No discharge.        Left eye: No discharge.     Extraocular Movements: Extraocular movements intact.     Right eye: Normal extraocular motion.     Left eye: Normal extraocular motion.     Conjunctiva/sclera: Conjunctivae normal.  Cardiovascular:     Rate and Rhythm: Normal rate and regular rhythm.     Heart sounds: Normal heart sounds. No murmur heard.    No friction rub. No gallop.  Pulmonary:     Effort: No respiratory distress.     Breath sounds: No stridor. Decreased breath sounds (throughout, persistent coughing during exam and visit) present.  No wheezing, rhonchi or rales.  Chest:     Chest wall: No tenderness.  Musculoskeletal:     Cervical back: Normal range of motion and neck supple.  Lymphadenopathy:     Cervical: No cervical adenopathy.  Skin:    General: Skin is warm and dry.  Neurological:     General: No focal deficit present.     Mental Status: She is alert and oriented to person, place, and time.  Psychiatric:        Mood and Affect: Mood normal.        Behavior: Behavior normal.        Thought Content: Thought content normal.        Judgment: Judgment normal.    DG Chest 2 View  Result Date: 09/24/2021 CLINICAL DATA:  Cough and shortness of breath. EXAM: CHEST - 2 VIEW COMPARISON:  None Available. FINDINGS: Cardiac silhouette and mediastinal contours are within normal limits. The lungs are clear. No pleural effusion or pneumothorax. Mild multilevel degenerative disc changes of the thoracic spine. ACDF hardware overlies the lower cervical spine. IMPRESSION: No active cardiopulmonary disease. Electronically Signed   By: Neita Garnet M.D.   On: 09/24/2021 10:14    Assessment and Plan :   PDMP not reviewed this encounter.  1. Acute bronchitis, unspecified organism   2. Persistent cough   3. Shortness of breath     Recommended an oral prednisone course for management of her acute bronchitis. Use supportive care otherwise. Low Wells criteria and therefore do not suspect chest PE. Negative chest x-ray and therefore will defer antibiotic use.  Given timeline of illness also deferred COVID testing.  Counseled patient on potential for adverse effects with medications prescribed/recommended today, ER and return-to-clinic precautions discussed, patient verbalized understanding.    Wallis Bamberg, PA-C 09/24/21 [id number]

## 2021-11-29 ENCOUNTER — Ambulatory Visit
Admission: EM | Admit: 2021-11-29 | Discharge: 2021-11-29 | Disposition: A | Discharge status: Home / Self Care | Payer: Federal, State, Local not specified - PPO | Attending: Emergency Medicine | Admitting: Emergency Medicine

## 2021-11-29 DIAGNOSIS — R051 Acute cough: Secondary | ICD-10-CM | POA: Insufficient documentation

## 2021-11-29 DIAGNOSIS — U071 COVID-19: Secondary | ICD-10-CM | POA: Diagnosis not present

## 2021-11-29 DIAGNOSIS — J209 Acute bronchitis, unspecified: Secondary | ICD-10-CM | POA: Diagnosis present

## 2021-11-29 MED ORDER — PREDNISONE 10 MG PO TABS: 40.0000 mg | ORAL_TABLET | Freq: Every day | ORAL | 0 refills | Status: AC

## 2021-11-29 NOTE — Discharge Instructions (Addendum)
Take the prednisone as directed.  Continue over-the-counter cough medication.  Follow up with your primary care provider if your symptoms are not improving.

## 2021-11-29 NOTE — ED Provider Notes (Signed)
[name]    CSN: [id number] Arrival date & time: 11/29/21  1235      History   Chief Complaint Chief Complaint  Patient presents with   Cough    HPI Shawna [name] is a 56 y.o. female.  Patient presents with chills, runny nose, congestion, and nonproductive cough x3 days.  She states she may have had a fever but has not taken her temperature.  She denies ear pain, sore throat, chest pain, shortness of breath, vomiting, diarrhea, or other symptoms.  Treatment at home with OTC cough medication.  She states her current symptoms are similar to previous episodes of bronchitis.  The history is provided by the patient and medical records.    Past Medical History:  Diagnosis Date   Contact lens/glasses fitting    Family history of breast cancer    mother   Tobacco abuse    TOBACCO ABUSE 06/10/2006   Qualifier: Diagnosis of  By: Jillyn Hidden FNP, Mcarthur Rossetti     Patient Active Problem List   Diagnosis Date Noted   Elevated blood pressure reading 04/11/2018   Acute left-sided thoracic back pain 10/25/2017   Cough 04/20/2017   Penicillin allergy 04/02/2012   Weight gain 02/01/2012   SINUSITIS - ACUTE-NOS 07/05/2008   HIATAL HERNIA 06/10/2006   UTI'S, CHRONIC 06/10/2006   ECZEMA, HANDS 06/10/2006    Past Surgical History:  Procedure Laterality Date   CERVICAL DISC SURGERY  2013   metal plate and screws   FOOT SURGERY     PILONIDAL CYST EXCISION  1990's, 2012   I&D [id number]   TUBAL LIGATION  02/2006   bilateral    OB History   No obstetric history on file.      Home Medications    Prior to Admission medications   Medication Sig Start Date End Date Taking? Authorizing Provider  predniSONE (DELTASONE) 10 MG tablet Take 4 tablets (40 mg total) by mouth daily for 5 days. 11/29/21 12/04/21 Yes Mickie Bail, NP  NP THYROID 60 MG tablet Take 60 mg by mouth daily. 10/13/21   [provider]  triamcinolone cream (KENALOG) 0.1 % Apply topically 2 (two)  times daily. Patient taking differently: Apply topically as needed. 09/27/12   Dianne Dun, MD    Family History Family History  Problem Relation Age of Onset   Alzheimer's disease Mother    Breast cancer Mother 7   Lung cancer Father        smoker   Cervical cancer Maternal Grandmother    Prostate cancer Maternal Grandfather    Colon cancer Neg Hx     Social History Social History   Tobacco Use   Smoking status: Former   Smokeless tobacco: Never   Tobacco comments:    Quit 12/11/10.  Substance Use Topics   Alcohol use: Yes    Comment: once monthly   Drug use: No     Allergies   Amoxicillin, Nitrofurantoin, Sulfonamide derivatives, Guaifenesin, and Sulfa antibiotics   Review of Systems Review of Systems  Constitutional:  Positive for chills. Negative for fever.  HENT:  Positive for congestion and rhinorrhea. Negative for ear pain and sore throat.   Respiratory:  Positive for cough. Negative for shortness of breath.   Cardiovascular:  Negative for chest pain and palpitations.  Gastrointestinal:  Negative for abdominal pain, diarrhea and vomiting.  Skin:  Negative for color change and rash.  All other systems reviewed and are negative.    Physical Exam  Triage Vital Signs ED Triage Vitals [11/29/21 1304]  Enc Vitals Group     BP 126/86     Pulse Rate 91     Resp 18     Temp 98.6 F (37 C)     Temp src      SpO2 98 %     Weight      Height      Head Circumference      Peak Flow      Pain Score      Pain Loc      Pain Edu?      Excl. in GC?    No data found.  Updated Vital Signs BP 126/86   Pulse 91   Temp 98.6 F (37 C)   Resp 18   Ht 5' 3.5" (1.613 m)   Wt 197 lb (89.4 kg)   SpO2 98%   BMI 34.35 kg/m   Visual Acuity Right Eye Distance:   Left Eye Distance:   Bilateral Distance:    Right Eye Near:   Left Eye Near:    Bilateral Near:     Physical Exam Vitals and nursing note reviewed.  Constitutional:      General: She is not in  acute distress.    Appearance: She is well-developed. She is not ill-appearing.  HENT:     Right Ear: Tympanic membrane normal.     Left Ear: Tympanic membrane normal.     Nose: Rhinorrhea present.     Mouth/Throat:     Mouth: Mucous membranes are moist.     Pharynx: Oropharynx is clear.  Cardiovascular:     Rate and Rhythm: Normal rate and regular rhythm.     Heart sounds: Normal heart sounds.  Pulmonary:     Effort: Pulmonary effort is normal. No respiratory distress.     Breath sounds: Normal breath sounds.  Musculoskeletal:     Cervical back: Neck supple.  Skin:    General: Skin is warm and dry.  Neurological:     Mental Status: She is alert.  Psychiatric:        Mood and Affect: Mood normal.        Behavior: Behavior normal.      UC Treatments / Results  Labs (all labs ordered are listed, but only abnormal results are displayed) Labs Reviewed  SARS CORONAVIRUS 2 (TAT 6-24 HRS)    EKG   Radiology No results found.  Procedures Procedures (including critical care time)  Medications Ordered in UC Medications - No data to display  Initial Impression / Assessment and Plan / UC Course  I have reviewed the triage vital signs and the nursing notes.  Pertinent labs & imaging results that were available during my care of the patient were reviewed by me and considered in my medical decision making (see chart for details).   Cough, acute bronchitis.  COVID pending.  Afebrile and vital signs are stable.  Treating bronchitis with prednisone as patient states this is worked well in the past.  Discussed symptomatic treatment including Tylenol or ibuprofen, rest, hydration.  Instructed patient to follow up with her PCP if her symptoms are not improving.  She agrees to plan of care.    Final Clinical Impressions(s) / UC Diagnoses   Final diagnoses:  Acute cough  Acute bronchitis, unspecified organism     Discharge Instructions      Take the prednisone as directed.   Continue over-the-counter cough medication.  Follow up with your primary  care provider if your symptoms are not improving.        ED Prescriptions     Medication Sig Dispense Auth. Provider   predniSONE (DELTASONE) 10 MG tablet Take 4 tablets (40 mg total) by mouth daily for 5 days. 20 tablet Sharion Balloon, NP      PDMP not reviewed this encounter.   Sharion Balloon, NP 11/29/21 1334

## 2021-11-29 NOTE — ED Triage Notes (Signed)
Patient to Urgent Care with complaints of strong dry cough x3-4 days. Reports possible fever and chills. States that her husband has the same symptoms.

## 2021-11-30 LAB — SARS CORONAVIRUS 2 (TAT 6-24 HRS): SARS Coronavirus 2: POSITIVE — AB

## 2022-11-16 ENCOUNTER — Ambulatory Visit
Admission: EM | Admit: 2022-11-16 | Discharge: 2022-11-16 | Disposition: A | Discharge status: Home / Self Care | Payer: Federal, State, Local not specified - PPO | Attending: Family Medicine | Admitting: Family Medicine

## 2022-11-16 DIAGNOSIS — J019 Acute sinusitis, unspecified: Secondary | ICD-10-CM | POA: Diagnosis not present

## 2022-11-16 MED ORDER — AZITHROMYCIN 250 MG PO TABS: ORAL_TABLET | ORAL | 0 refills | Status: DC

## 2022-11-16 MED ORDER — PROMETHAZINE-DM 6.25-15 MG/5ML PO SYRP: 5.0000 mL | ORAL_SOLUTION | Freq: Four times a day (QID) | ORAL | 0 refills | Status: AC | PRN

## 2022-11-16 NOTE — ED Triage Notes (Signed)
Patient to Urgent Care with complaints of cough/ sneezing/ sinus pain and pressure/ headache. Ear fullness. Reports cough is productive with green mucus.  Symptoms started Wednesday/ Thursday. Denies any known fevers.   Using Flonase/ sudafed.

## 2022-11-16 NOTE — ED Provider Notes (Signed)
[facility]ler    CSN: [id number] Arrival date & time: 11/16/22  7846      History   Chief Complaint Chief Complaint  Patient presents with   Nasal Congestion    HPI Shawna [name] is a 57 y.o. female.   HPI Patient presents today with sinus symptoms present for 5-6 days. Initially began nasal congestion, now facial pressure and headache with cough. Denies fever, sore throat, or body aches. Denies any known sick contacts. Denies any history of recurrent sinus infections or chronic seasonal allergies. She has taken Sudafed and used Flonase with minimal improvement of pain symptoms.  Past Medical History:  Diagnosis Date   Contact lens/glasses fitting    Family history of breast cancer    mother   Tobacco abuse    TOBACCO ABUSE 06/10/2006   Qualifier: Diagnosis of  By: Jillyn Hidden FNP, Mcarthur Rossetti     Patient Active Problem List   Diagnosis Date Noted   Elevated blood pressure reading 04/11/2018   Acute left-sided thoracic back pain 10/25/2017   Cough 04/20/2017   Penicillin allergy 04/02/2012   Weight gain 02/01/2012   SINUSITIS - ACUTE-NOS 07/05/2008   HIATAL HERNIA 06/10/2006   UTI'S, CHRONIC 06/10/2006   ECZEMA, HANDS 06/10/2006    Past Surgical History:  Procedure Laterality Date   CERVICAL DISC SURGERY  2013   metal plate and screws   FOOT SURGERY     PILONIDAL CYST EXCISION  1990's, 2012   I&D [id number]   TUBAL LIGATION  02/2006   bilateral    OB History   No obstetric history on file.      Home Medications    Prior to Admission medications   Medication Sig Start Date End Date Taking? Authorizing Provider  azithromycin (ZITHROMAX) 250 MG tablet Take 2 tabs PO x 1 dose, then 1 tab PO QD x 4 days 11/16/22  Yes Bing Neighbors, NP  promethazine-dextromethorphan (PROMETHAZINE-DM) 6.25-15 MG/5ML syrup Take 5 mLs by mouth 4 (four) times daily as needed for cough. 11/16/22  Yes Bing Neighbors, NP  NP THYROID 60 MG tablet Take 60 mg by mouth  daily. 10/13/21   [provider]  triamcinolone cream (KENALOG) 0.1 % Apply topically 2 (two) times daily. Patient taking differently: Apply topically as needed. 09/27/12   Dianne Dun, MD    Family History Family History  Problem Relation Age of Onset   Alzheimer's disease Mother    Breast cancer Mother 27   Lung cancer Father        smoker   Cervical cancer Maternal Grandmother    Prostate cancer Maternal Grandfather    Colon cancer Neg Hx     Social History Social History   Tobacco Use   Smoking status: Former   Smokeless tobacco: Never   Tobacco comments:    Quit 12/11/10.  Substance Use Topics   Alcohol use: Yes    Comment: once monthly   Drug use: No     Allergies   Amoxicillin, Nitrofurantoin, Sulfonamide derivatives, Guaifenesin, and Sulfa antibiotics   Review of Systems Review of Systems Pertinent negatives listed in HPI  Physical Exam Triage Vital Signs ED Triage Vitals  Encounter Vitals Group     BP 11/16/22 1045 135/73     Systolic BP Percentile --      Diastolic BP Percentile --      Pulse Rate 11/16/22 1045 82     Resp 11/16/22 1045 18     Temp 11/16/22  1045 (!) 97.5 F (36.4 C)     Temp src --      SpO2 11/16/22 1045 98 %     Weight --      Height --      Head Circumference --      Peak Flow --      Pain Score 11/16/22 1043 8     Pain Loc --      Pain Education --      Exclude from Growth Chart --    No data found.  Updated Vital Signs BP 135/73   Pulse 82   Temp (!) 97.5 F (36.4 C)   Resp 18   SpO2 98%   Visual Acuity Right Eye Distance:   Left Eye Distance:   Bilateral Distance:    Right Eye Near:   Left Eye Near:    Bilateral Near:     Physical Exam General Appearance:    Alert, cooperative, no distress  HENT:   Normocephalic, bilateral TM normal, neck without nodes, frontal  sinus tender, post nasal drip noted, and nasal mucosa congested  Eyes:    PERRL, conjunctiva/corneas clear, EOM's intact       Lungs:      Clear to auscultation bilaterally, respirations unlabored  Heart:    Regular rate and rhythm  Neurologic:   Awake, alert, oriented x 3. No apparent focal neurological           defect.         UC Treatments / Results  Labs (all labs ordered are listed, but only abnormal results are displayed) Labs Reviewed - No data to display  EKG   Radiology No results found.  Procedures Procedures (including critical care time)  Medications Ordered in UC Medications - No data to display  Initial Impression / Assessment and Plan / UC Course  I have reviewed the triage vital signs and the nursing notes.  Pertinent labs & imaging results that were available during my care of the patient were reviewed by me and considered in my medical decision making (see chart for details).   Acute Sinusitis treatment per discharge medication orders. Continue Flonase and Sudafed.  Return if symptoms worsen or do not improve. Final Clinical Impressions(s) / UC Diagnoses   Final diagnoses:  Acute non-recurrent sinusitis, unspecified location     Discharge Instructions      Return if symptoms worsen or do not improve.     ED Prescriptions     Medication Sig Dispense Auth. Provider   azithromycin (ZITHROMAX) 250 MG tablet Take 2 tabs PO x 1 dose, then 1 tab PO QD x 4 days 6 tablet Bing Neighbors, NP   promethazine-dextromethorphan (PROMETHAZINE-DM) 6.25-15 MG/5ML syrup Take 5 mLs by mouth 4 (four) times daily as needed for cough. 240 mL Bing Neighbors, NP      PDMP not reviewed this encounter.   Bing Neighbors, NP 11/16/22 1247

## 2022-11-16 NOTE — Discharge Instructions (Signed)
Return if symptoms worsen or do not improve.

## 2024-02-10 ENCOUNTER — Ambulatory Visit
Admission: RE | Admit: 2024-02-10 | Discharge: 2024-02-10 | Disposition: A | Discharge status: Home / Self Care | Payer: Self-pay

## 2024-02-10 VITALS — BP 132/82 | HR 85 | Temp 98.5°F | Resp 18

## 2024-02-10 DIAGNOSIS — R051 Acute cough: Secondary | ICD-10-CM | POA: Diagnosis not present

## 2024-02-10 DIAGNOSIS — J069 Acute upper respiratory infection, unspecified: Secondary | ICD-10-CM | POA: Diagnosis not present

## 2024-02-10 MED ORDER — ALBUTEROL SULFATE HFA 108 (90 BASE) MCG/ACT IN AERS: 1.0000 | INHALATION_SPRAY | Freq: Four times a day (QID) | RESPIRATORY_TRACT | 0 refills | Status: AC | PRN

## 2024-02-10 MED ORDER — AZITHROMYCIN 250 MG PO TABS: 250.0000 mg | ORAL_TABLET | Freq: Every day | ORAL | 0 refills | Status: AC

## 2024-02-10 NOTE — Discharge Instructions (Signed)
 Use the albuterol  inhaler and take the Zithromax  as directed.  Follow-up with your primary care provider.

## 2024-02-10 NOTE — ED Provider Notes (Signed)
 Shawna [name]    CSN: [id number] Arrival date & time: 02/10/24  0802      History   Chief Complaint Chief Complaint  Patient presents with   Cough    Possible Sinus Infection - Entered by patient    HPI Shawna [name] is a 58 y.o. female.  Patient presents with 4-day history of congestion, sinus pressure, runny nose, cough.  Her ribs are sore from coughing.  No fever, shortness of breath, vomiting, diarrhea.  Treatment attempted with OTC cold and sinus medication.  The history is provided by the patient and medical records.    Past Medical History:  Diagnosis Date   Contact lens/glasses fitting    Family history of breast cancer    mother   Tobacco abuse    TOBACCO ABUSE 06/10/2006   Qualifier: Diagnosis of  By: Baird FNP, Frieda Kipper     Patient Active Problem List   Diagnosis Date Noted   Elevated blood pressure reading 04/11/2018   Acute left-sided thoracic back pain 10/25/2017   Cough 04/20/2017   Penicillin allergy 04/02/2012   Weight gain 02/01/2012   SINUSITIS - ACUTE-NOS 07/05/2008   HIATAL HERNIA 06/10/2006   UTI'S, CHRONIC 06/10/2006   ECZEMA, HANDS 06/10/2006    Past Surgical History:  Procedure Laterality Date   CERVICAL DISC SURGERY  2013   metal plate and screws   FOOT SURGERY     PILONIDAL CYST EXCISION  1990's, 2012   I&D [id number]   TUBAL LIGATION  02/2006   bilateral    OB History   No obstetric history on file.      Home Medications    Prior to Admission medications   Medication Sig Start Date End Date Taking? Authorizing Provider  albuterol  (VENTOLIN  HFA) 108 (90 Base) MCG/ACT inhaler Inhale 1-2 puffs into the lungs every 6 (six) hours as needed. 02/10/24  Yes Corlis Burnard DEL, NP  azithromycin  (ZITHROMAX ) 250 MG tablet Take 1 tablet (250 mg total) by mouth daily. Take first 2 tablets together, then 1 every day until finished. 02/10/24  Yes Corlis Burnard DEL, NP  estradiol  (VIVELLE -DOT) 0.075 MG/24HR Place onto the skin.  01/20/24  Yes [provider]  NP THYROID  60 MG tablet Take 60 mg by mouth daily. 10/13/21   [provider]  promethazine -dextromethorphan (PROMETHAZINE -DM) 6.25-15 MG/5ML syrup Take 5 mLs by mouth 4 (four) times daily as needed for cough. Patient not taking: Reported on 02/10/2024 11/16/22   Arloa Suzen GORMAN, NP  triamcinolone  cream (KENALOG ) 0.1 % Apply topically 2 (two) times daily. Patient taking differently: Apply topically as needed. 09/27/12   Jenetta Holmes HERO, MD    Family History Family History  Problem Relation Age of Onset   Alzheimer's disease Mother    Breast cancer Mother 37   Lung cancer Father        smoker   Cervical cancer Maternal Grandmother    Prostate cancer Maternal Grandfather    Colon cancer Neg Hx     Social History Social History   Tobacco Use   Smoking status: Former   Smokeless tobacco: Never   Tobacco comments:    Quit 12/11/10.  Substance Use Topics   Alcohol use: Yes    Comment: once monthly   Drug use: No     Allergies   Amoxicillin, Nitrofurantoin, Sulfonamide derivatives, Guaifenesin, and Sulfa antibiotics   Review of Systems Review of Systems  Constitutional:  Negative for chills and fever.  HENT:  Positive for congestion,  postnasal drip, rhinorrhea and sinus pressure. Negative for ear pain and sore throat.   Respiratory:  Positive for cough. Negative for shortness of breath.   Gastrointestinal:  Negative for diarrhea and vomiting.     Physical Exam Triage Vital Signs ED Triage Vitals  Encounter Vitals Group     BP 02/10/24 0810 132/82     Girls Systolic BP Percentile --      Girls Diastolic BP Percentile --      Boys Systolic BP Percentile --      Boys Diastolic BP Percentile --      Pulse Rate 02/10/24 0810 85     Resp 02/10/24 0810 18     Temp 02/10/24 0810 98.5 F (36.9 C)     Temp src --      SpO2 02/10/24 0810 98 %     Weight --      Height --      Head Circumference --      Peak Flow --      Pain  Score 02/10/24 0807 4     Pain Loc --      Pain Education --      Exclude from Growth Chart --    No data found.  Updated Vital Signs BP 132/82   Pulse 85   Temp 98.5 F (36.9 C)   Resp 18   SpO2 98%   Visual Acuity Right Eye Distance:   Left Eye Distance:   Bilateral Distance:    Right Eye Near:   Left Eye Near:    Bilateral Near:     Physical Exam Constitutional:      General: She is not in acute distress. HENT:     Right Ear: Tympanic membrane normal.     Left Ear: Tympanic membrane normal.     Nose: Congestion and rhinorrhea present.     Mouth/Throat:     Mouth: Mucous membranes are moist.     Pharynx: Oropharynx is clear.  Cardiovascular:     Rate and Rhythm: Normal rate and regular rhythm.     Heart sounds: Normal heart sounds.  Pulmonary:     Effort: Pulmonary effort is normal. No respiratory distress.     Breath sounds: Normal breath sounds.     Comments: Frequent tight-sounding nonproductive cough. Neurological:     Mental Status: She is alert.      UC Treatments / Results  Labs (all labs ordered are listed, but only abnormal results are displayed) Labs Reviewed - No data to display  EKG   Radiology No results found.  Procedures Procedures (including critical care time)  Medications Ordered in UC Medications - No data to display  Initial Impression / Assessment and Plan / UC Course  I have reviewed the triage vital signs and the nursing notes.  Pertinent labs & imaging results that were available during my care of the patient were reviewed by me and considered in my medical decision making (see chart for details).    Acute upper respiratory infection, cough.  Afebrile and vital signs are stable.  Lungs are clear and O2 sat is 98% on room air.  Patient has a frequent tight-sounding nonproductive cough during exam today.  Treating today with albuterol  inhaler and Zithromax .  Education provided on upper respiratory infection and cough.   Instructed her to follow-up with her PCP.  She agrees to plan of care.  Final Clinical Impressions(s) / UC Diagnoses   Final diagnoses:  Acute upper respiratory infection  Acute cough     Discharge Instructions      Use the albuterol  inhaler and take the Zithromax  as directed.  Follow-up with your primary care provider.     ED Prescriptions     Medication Sig Dispense Auth. Provider   azithromycin  (ZITHROMAX ) 250 MG tablet Take 1 tablet (250 mg total) by mouth daily. Take first 2 tablets together, then 1 every day until finished. 6 tablet Corlis Burnard DEL, NP   albuterol  (VENTOLIN  HFA) 108 (90 Base) MCG/ACT inhaler Inhale 1-2 puffs into the lungs every 6 (six) hours as needed. 18 g Corlis Burnard DEL, NP      PDMP not reviewed this encounter.   Corlis Burnard DEL, NP 02/10/24 [phone]

## 2024-02-10 NOTE — ED Triage Notes (Signed)
 Patient to Urgent Care with complaints of productive cough (green), nasal congestion, runny nose. Rib pain from coughing. Possible fevers.   Symptoms x4 days.   Meds: flonase/ sudafed congestion/ on guard cough drops.
